# Patient Record
Sex: Male | Born: 1950 | Hispanic: No | Marital: Single | Smoking: Current every day smoker
Health system: Southern US, Community
[De-identification: ages and names within clinical notes are randomized; demographics above are authoritative.]

---

## 2016-10-16 ENCOUNTER — Inpatient Hospital Stay (HOSPITAL_COMMUNITY): Payer: Medicare Other

## 2016-10-16 ENCOUNTER — Emergency Department (HOSPITAL_COMMUNITY): Payer: Medicare Other

## 2016-10-16 ENCOUNTER — Inpatient Hospital Stay (HOSPITAL_COMMUNITY)
Admission: EM | Admit: 2016-10-16 | Discharge: 2016-10-23 | DRG: 270 | Disposition: A | Discharge status: Home / Self Care | Payer: Medicare Other | Attending: Cardiovascular Disease | Admitting: Cardiovascular Disease

## 2016-10-16 ENCOUNTER — Encounter (HOSPITAL_COMMUNITY)
Admission: EM | Disposition: A | Discharge status: Home / Self Care | Payer: Self-pay | Source: Home / Self Care | Attending: Cardiovascular Disease

## 2016-10-16 ENCOUNTER — Encounter (HOSPITAL_COMMUNITY): Payer: Self-pay | Admitting: Cardiology

## 2016-10-16 DIAGNOSIS — I5021 Acute systolic (congestive) heart failure: Secondary | ICD-10-CM | POA: Diagnosis not present

## 2016-10-16 DIAGNOSIS — R739 Hyperglycemia, unspecified: Secondary | ICD-10-CM | POA: Diagnosis present

## 2016-10-16 DIAGNOSIS — Z7982 Long term (current) use of aspirin: Secondary | ICD-10-CM

## 2016-10-16 DIAGNOSIS — E874 Mixed disorder of acid-base balance: Secondary | ICD-10-CM | POA: Diagnosis present

## 2016-10-16 DIAGNOSIS — R570 Cardiogenic shock: Secondary | ICD-10-CM | POA: Diagnosis present

## 2016-10-16 DIAGNOSIS — I472 Ventricular tachycardia: Secondary | ICD-10-CM | POA: Diagnosis present

## 2016-10-16 DIAGNOSIS — E871 Hypo-osmolality and hyponatremia: Secondary | ICD-10-CM | POA: Diagnosis not present

## 2016-10-16 DIAGNOSIS — I2102 ST elevation (STEMI) myocardial infarction involving left anterior descending coronary artery: Secondary | ICD-10-CM | POA: Diagnosis present

## 2016-10-16 DIAGNOSIS — J9600 Acute respiratory failure, unspecified whether with hypoxia or hypercapnia: Secondary | ICD-10-CM

## 2016-10-16 DIAGNOSIS — I469 Cardiac arrest, cause unspecified: Secondary | ICD-10-CM | POA: Diagnosis present

## 2016-10-16 DIAGNOSIS — F17210 Nicotine dependence, cigarettes, uncomplicated: Secondary | ICD-10-CM | POA: Diagnosis present

## 2016-10-16 DIAGNOSIS — F17200 Nicotine dependence, unspecified, uncomplicated: Secondary | ICD-10-CM | POA: Diagnosis present

## 2016-10-16 DIAGNOSIS — Z9889 Other specified postprocedural states: Secondary | ICD-10-CM

## 2016-10-16 DIAGNOSIS — I340 Nonrheumatic mitral (valve) insufficiency: Secondary | ICD-10-CM | POA: Diagnosis present

## 2016-10-16 DIAGNOSIS — K4020 Bilateral inguinal hernia, without obstruction or gangrene, not specified as recurrent: Secondary | ICD-10-CM | POA: Diagnosis present

## 2016-10-16 DIAGNOSIS — I4510 Unspecified right bundle-branch block: Secondary | ICD-10-CM | POA: Diagnosis present

## 2016-10-16 DIAGNOSIS — J690 Pneumonitis due to inhalation of food and vomit: Secondary | ICD-10-CM | POA: Diagnosis not present

## 2016-10-16 DIAGNOSIS — I2510 Atherosclerotic heart disease of native coronary artery without angina pectoris: Secondary | ICD-10-CM | POA: Diagnosis present

## 2016-10-16 DIAGNOSIS — J9690 Respiratory failure, unspecified, unspecified whether with hypoxia or hypercapnia: Secondary | ICD-10-CM | POA: Diagnosis present

## 2016-10-16 DIAGNOSIS — J9601 Acute respiratory failure with hypoxia: Secondary | ICD-10-CM | POA: Diagnosis present

## 2016-10-16 DIAGNOSIS — G9340 Encephalopathy, unspecified: Secondary | ICD-10-CM | POA: Diagnosis present

## 2016-10-16 DIAGNOSIS — Z955 Presence of coronary angioplasty implant and graft: Secondary | ICD-10-CM

## 2016-10-16 DIAGNOSIS — I213 ST elevation (STEMI) myocardial infarction of unspecified site: Secondary | ICD-10-CM | POA: Diagnosis present

## 2016-10-16 DIAGNOSIS — R680 Hypothermia, not associated with low environmental temperature: Secondary | ICD-10-CM | POA: Diagnosis not present

## 2016-10-16 DIAGNOSIS — I4901 Ventricular fibrillation: Secondary | ICD-10-CM

## 2016-10-16 DIAGNOSIS — E876 Hypokalemia: Secondary | ICD-10-CM | POA: Diagnosis not present

## 2016-10-16 DIAGNOSIS — G931 Anoxic brain damage, not elsewhere classified: Secondary | ICD-10-CM

## 2016-10-16 HISTORY — PX: IABP INSERTION: CATH118242

## 2016-10-16 HISTORY — PX: CARDIOVERSION: EP1203

## 2016-10-16 HISTORY — PX: ABDOMINAL AORTOGRAM: CATH118222

## 2016-10-16 HISTORY — PX: LEFT HEART CATH AND CORONARY ANGIOGRAPHY: CATH118249

## 2016-10-16 LAB — I-STAT TROPONIN, ED: Troponin i, poc: 0.89 ng/mL (ref 0.00–0.08)

## 2016-10-16 LAB — POCT I-STAT 3, ART BLOOD GAS (G3+)
ACID-BASE DEFICIT: 2 mmol/L (ref 0.0–2.0)
ACID-BASE DEFICIT: 8 mmol/L — AB (ref 0.0–2.0)
Acid-base deficit: 12 mmol/L — ABNORMAL HIGH (ref 0.0–2.0)
Acid-base deficit: 17 mmol/L — ABNORMAL HIGH (ref 0.0–2.0)
Acid-base deficit: 6 mmol/L — ABNORMAL HIGH (ref 0.0–2.0)
Bicarbonate: 13.7 mmol/L — ABNORMAL LOW (ref 20.0–28.0)
Bicarbonate: 17.4 mmol/L — ABNORMAL LOW (ref 20.0–28.0)
Bicarbonate: 20.1 mmol/L (ref 20.0–28.0)
Bicarbonate: 24 mmol/L (ref 20.0–28.0)
Bicarbonate: 21.8 mmol/L (ref 20.0–28.0)
O2 SAT: 90 %
O2 SAT: 85 %
O2 Saturation: 100 %
O2 Saturation: 100 %
O2 Saturation: 97 %
PCO2 ART: 52.9 mmHg — AB (ref 32.0–48.0)
PCO2 ART: 39.9 mmHg (ref 32.0–48.0)
PCO2 ART: 49.2 mmHg — AB (ref 32.0–48.0)
PH ART: 7.045 — AB (ref 7.350–7.450)
PH ART: 7.155 — AB (ref 7.350–7.450)
PO2 ART: 73 mmHg — AB (ref 83.0–108.0)
PO2 ART: 61 mmHg — AB (ref 83.0–108.0)
Patient temperature: 102.5
TCO2: 15 mmol/L (ref 0–100)
TCO2: 19 mmol/L (ref 0–100)
TCO2: 21 mmol/L (ref 0–100)
TCO2: 25 mmol/L (ref 0–100)
TCO2: 24 mmol/L (ref 0–100)
pCO2 arterial: 50.1 mmHg — ABNORMAL HIGH (ref 32.0–48.0)
pCO2 arterial: 60.8 mmHg — ABNORMAL HIGH (ref 32.0–48.0)
pH, Arterial: 7.126 — CL (ref 7.350–7.450)
pH, Arterial: 7.31 — ABNORMAL LOW (ref 7.350–7.450)
pH, Arterial: 7.306 — ABNORMAL LOW (ref 7.350–7.450)
pO2, Arterial: 247 mmHg — ABNORMAL HIGH (ref 83.0–108.0)
pO2, Arterial: 378 mmHg — ABNORMAL HIGH (ref 83.0–108.0)
pO2, Arterial: 102 mmHg (ref 83.0–108.0)

## 2016-10-16 LAB — COMPREHENSIVE METABOLIC PANEL
ALBUMIN: 3.9 g/dL (ref 3.5–5.0)
ALT: 23 U/L (ref 17–63)
AST: 37 U/L (ref 15–41)
Alkaline Phosphatase: 71 U/L (ref 38–126)
Anion gap: 11 (ref 5–15)
BILIRUBIN TOTAL: 0.5 mg/dL (ref 0.3–1.2)
BUN: 8 mg/dL (ref 6–20)
CHLORIDE: 106 mmol/L (ref 101–111)
CO2: 22 mmol/L (ref 22–32)
CREATININE: 1.06 mg/dL (ref 0.61–1.24)
Calcium: 9.3 mg/dL (ref 8.9–10.3)
GFR calc Af Amer: >60 mL/min (ref >60)
GLUCOSE: 155 mg/dL — AB (ref 65–99)
Potassium: 3.5 mmol/L (ref 3.5–5.1)
Sodium: 139 mmol/L (ref 135–145)
TOTAL PROTEIN: 7.3 g/dL (ref 6.5–8.1)

## 2016-10-16 LAB — PROTIME-INR
INR: 0.96
INR: 1.59
Prothrombin Time: 12.8 seconds (ref 11.4–15.2)
Prothrombin Time: 19.1 seconds — ABNORMAL HIGH (ref 11.4–15.2)

## 2016-10-16 LAB — CBC WITH DIFFERENTIAL/PLATELET
Basophils Absolute: 0 10*3/uL (ref 0.0–0.1)
Basophils Relative: 0 %
Eosinophils Absolute: 0.2 10*3/uL (ref 0.0–0.7)
Eosinophils Relative: 1 %
HCT: 46.1 % (ref 39.0–52.0)
Hemoglobin: 15.2 g/dL (ref 13.0–17.0)
Lymphocytes Relative: 35 %
Lymphs Abs: 7.4 10*3/uL — ABNORMAL HIGH (ref 0.7–4.0)
MCH: 25.9 pg — ABNORMAL LOW (ref 26.0–34.0)
MCHC: 33 g/dL (ref 30.0–36.0)
MCV: 78.5 fL (ref 78.0–100.0)
Monocytes Absolute: 1.3 10*3/uL — ABNORMAL HIGH (ref 0.1–1.0)
Monocytes Relative: 6 %
Neutro Abs: 12.1 10*3/uL — ABNORMAL HIGH (ref 1.7–7.7)
Neutrophils Relative %: 58 %
Platelets: 377 10*3/uL (ref 150–400)
RBC: 5.87 MIL/uL — ABNORMAL HIGH (ref 4.22–5.81)
RDW: 14.3 % (ref 11.5–15.5)
WBC: 21 10*3/uL — ABNORMAL HIGH (ref 4.0–10.5)

## 2016-10-16 LAB — APTT: aPTT: 30 seconds (ref 24–36)

## 2016-10-16 LAB — BASIC METABOLIC PANEL
ANION GAP: 7 (ref 5–15)
BUN: 8 mg/dL (ref 6–20)
CALCIUM: 8.3 mg/dL — AB (ref 8.9–10.3)
CO2: 25 mmol/L (ref 22–32)
Chloride: 108 mmol/L (ref 101–111)
Creatinine, Ser: 0.94 mg/dL (ref 0.61–1.24)
Glucose, Bld: 135 mg/dL — ABNORMAL HIGH (ref 65–99)
POTASSIUM: 3.7 mmol/L (ref 3.5–5.1)
Sodium: 140 mmol/L (ref 135–145)

## 2016-10-16 LAB — I-STAT CHEM 8, ED
BUN: 8 mg/dL (ref 6–20)
CALCIUM ION: 1.15 mmol/L (ref 1.15–1.40)
CHLORIDE: 104 mmol/L (ref 101–111)
CREATININE: 1 mg/dL (ref 0.61–1.24)
GLUCOSE: 154 mg/dL — AB (ref 65–99)
HCT: 49 % (ref 39.0–52.0)
Hemoglobin: 16.7 g/dL (ref 13.0–17.0)
Potassium: 3.3 mmol/L — ABNORMAL LOW (ref 3.5–5.1)
Sodium: 142 mmol/L (ref 135–145)
TCO2: 26 mmol/L (ref 0–100)

## 2016-10-16 LAB — GLUCOSE, CAPILLARY
GLUCOSE-CAPILLARY: 134 mg/dL — AB (ref 65–99)
GLUCOSE-CAPILLARY: 165 mg/dL — AB (ref 65–99)
Glucose-Capillary: 142 mg/dL — ABNORMAL HIGH (ref 65–99)

## 2016-10-16 LAB — TROPONIN I
TROPONIN I: 0.77 ng/mL — AB (ref <0.03)
TROPONIN I: 11.39 ng/mL — AB (ref <0.03)
TROPONIN I: 25.67 ng/mL — AB (ref <0.03)

## 2016-10-16 LAB — MAGNESIUM: Magnesium: 2.3 mg/dL (ref 1.7–2.4)

## 2016-10-16 LAB — RAPID URINE DRUG SCREEN, HOSP PERFORMED
Amphetamines: NOT DETECTED
Barbiturates: NOT DETECTED
Benzodiazepines: POSITIVE — AB
COCAINE: NOT DETECTED
OPIATES: NOT DETECTED
Tetrahydrocannabinol: NOT DETECTED

## 2016-10-16 LAB — LIPID PANEL
Cholesterol: 241 mg/dL — ABNORMAL HIGH (ref 0–200)
HDL: 40 mg/dL — AB (ref >40)
LDL Cholesterol: 173 mg/dL — ABNORMAL HIGH (ref 0–99)
TRIGLYCERIDES: 142 mg/dL (ref <150)
Total CHOL/HDL Ratio: 6 RATIO
VLDL: 28 mg/dL (ref 0–40)

## 2016-10-16 LAB — LACTIC ACID, PLASMA
LACTIC ACID, VENOUS: 2.5 mmol/L — AB (ref 0.5–1.9)
Lactic Acid, Venous: 2.6 mmol/L (ref 0.5–1.9)

## 2016-10-16 LAB — POCT ACTIVATED CLOTTING TIME: ACTIVATED CLOTTING TIME: 378 s

## 2016-10-16 LAB — TSH: TSH: 1.828 u[IU]/mL (ref 0.350–4.500)

## 2016-10-16 LAB — MRSA PCR SCREENING: MRSA by PCR: NEGATIVE

## 2016-10-16 LAB — ETHANOL: Alcohol, Ethyl (B): <5 mg/dL (ref <5)

## 2016-10-16 LAB — PROCALCITONIN: PROCALCITONIN: 0.24 ng/mL

## 2016-10-16 LAB — HEPARIN LEVEL (UNFRACTIONATED): Heparin Unfractionated: 0.19 IU/mL — ABNORMAL LOW (ref 0.30–0.70)

## 2016-10-16 LAB — PATHOLOGIST SMEAR REVIEW

## 2016-10-16 SURGERY — LEFT HEART CATH AND CORONARY ANGIOGRAPHY
Anesthesia: LOCAL

## 2016-10-16 MED ORDER — AMIODARONE HCL 150 MG/3ML IV SOLN
INTRAVENOUS | Status: AC
  Filled 2016-10-16: qty 3

## 2016-10-16 MED ORDER — SODIUM CHLORIDE 0.9 % IV SOLN
INTRAVENOUS | Status: DC
  Administered 2016-10-16 – 2016-10-18 (×2): via INTRAVENOUS

## 2016-10-16 MED ORDER — ONDANSETRON HCL 4 MG/2ML IJ SOLN: 4.0000 mg | Freq: Four times a day (QID) | INTRAMUSCULAR | Status: DC | PRN

## 2016-10-16 MED ORDER — SODIUM CHLORIDE 0.9 % IV SOLN: 4.0000 ug/kg/min | INTRAVENOUS | Status: DC

## 2016-10-16 MED ORDER — SODIUM BICARBONATE 8.4 % IV SOLN
INTRAVENOUS | Status: AC
  Filled 2016-10-16: qty 100

## 2016-10-16 MED ORDER — HEPARIN (PORCINE) IN NACL 2-0.9 UNIT/ML-% IJ SOLN
INTRAMUSCULAR | Status: AC
Start: 2016-10-16 — End: 2016-10-16
  Filled 2016-10-16: qty 500

## 2016-10-16 MED ORDER — CHLORHEXIDINE GLUCONATE 0.12% ORAL RINSE (MEDLINE KIT)
15.0000 mL | Freq: Two times a day (BID) | OROMUCOSAL | Status: DC
  Administered 2016-10-16 – 2016-10-20 (×8): 15 mL via OROMUCOSAL

## 2016-10-16 MED ORDER — ACETAMINOPHEN 325 MG PO TABS
650.0000 mg | ORAL_TABLET | ORAL | Status: DC | PRN
  Administered 2016-10-16 – 2016-10-17 (×2): 650 mg via ORAL
  Filled 2016-10-16 (×2): qty 2

## 2016-10-16 MED ORDER — DOPAMINE-DEXTROSE 3.2-5 MG/ML-% IV SOLN
2.0000 ug/kg/min | INTRAVENOUS | Status: DC
  Administered 2016-10-16: 15 ug/kg/min via INTRAVENOUS

## 2016-10-16 MED ORDER — SODIUM BICARBONATE 8.4 % IV SOLN
INTRAVENOUS | Status: DC | PRN
  Administered 2016-10-16: 50 meq via INTRAVENOUS

## 2016-10-16 MED ORDER — SODIUM BICARBONATE 8.4 % IV SOLN
INTRAVENOUS | Status: AC
  Filled 2016-10-16: qty 50

## 2016-10-16 MED ORDER — IOPAMIDOL (ISOVUE-370) INJECTION 76%
INTRAVENOUS | Status: AC
  Filled 2016-10-16: qty 100

## 2016-10-16 MED ORDER — VECURONIUM BROMIDE 10 MG IV SOLR
6.0000 mg | Freq: Once | INTRAVENOUS | Status: AC
  Administered 2016-10-16: 6 mg via INTRAVENOUS

## 2016-10-16 MED ORDER — LIDOCAINE HCL (PF) 1 % IJ SOLN
INTRAMUSCULAR | Status: DC | PRN
  Administered 2016-10-16: 15 mL via INTRADERMAL

## 2016-10-16 MED ORDER — SODIUM CHLORIDE 0.9% FLUSH
3.0000 mL | Freq: Two times a day (BID) | INTRAVENOUS | Status: DC
  Administered 2016-10-16 – 2016-10-22 (×12): 3 mL via INTRAVENOUS

## 2016-10-16 MED ORDER — BIVALIRUDIN TRIFLUOROACETATE 250 MG IV SOLR
INTRAVENOUS | Status: AC
  Filled 2016-10-16: qty 250

## 2016-10-16 MED ORDER — TICAGRELOR 90 MG PO TABS
180.0000 mg | ORAL_TABLET | Freq: Once | ORAL | Status: AC
  Administered 2016-10-16: 180 mg via ORAL
  Filled 2016-10-16: qty 2

## 2016-10-16 MED ORDER — HEPARIN SODIUM (PORCINE) 5000 UNIT/ML IJ SOLN
4000.0000 [IU] | Freq: Once | INTRAMUSCULAR | Status: AC
  Administered 2016-10-16: 4000 [IU] via INTRAVENOUS

## 2016-10-16 MED ORDER — FENTANYL BOLUS VIA INFUSION
25.0000 ug | INTRAVENOUS | Status: DC | PRN
  Administered 2016-10-17: 25 ug via INTRAVENOUS
  Filled 2016-10-16: qty 25

## 2016-10-16 MED ORDER — MIDAZOLAM HCL 2 MG/2ML IJ SOLN: 1.0000 mg | INTRAMUSCULAR | Status: DC | PRN

## 2016-10-16 MED ORDER — SODIUM CHLORIDE 0.9% FLUSH: 10.0000 mL | INTRAVENOUS | Status: DC | PRN

## 2016-10-16 MED ORDER — DEXTROSE 5 % IV SOLN
INTRAVENOUS | Status: AC | PRN
  Administered 2016-10-16: 300 mg via INTRAVENOUS

## 2016-10-16 MED ORDER — HEPARIN SODIUM (PORCINE) 5000 UNIT/ML IJ SOLN
INTRAMUSCULAR | Status: AC
  Filled 2016-10-16: qty 1

## 2016-10-16 MED ORDER — SODIUM CHLORIDE 0.9 % IV SOLN
INTRAVENOUS | Status: DC | PRN
  Administered 2016-10-16: 10 mL/h via INTRAVENOUS
  Administered 2016-10-16: 20 mL/h via INTRAVENOUS

## 2016-10-16 MED ORDER — CANGRELOR TETRASODIUM 50 MG IV SOLR
INTRAVENOUS | Status: AC
  Filled 2016-10-16: qty 50

## 2016-10-16 MED ORDER — DOPAMINE-DEXTROSE 3.2-5 MG/ML-% IV SOLN
INTRAVENOUS | Status: DC | PRN
  Administered 2016-10-16: 20 ug/kg/min via INTRAVENOUS

## 2016-10-16 MED ORDER — LIDOCAINE HCL (CARDIAC) 20 MG/ML IV SOLN
INTRAVENOUS | Status: DC | PRN
  Administered 2016-10-16: 100 mg via INTRAVENOUS

## 2016-10-16 MED ORDER — MIDAZOLAM HCL 2 MG/2ML IJ SOLN
INTRAMUSCULAR | Status: DC | PRN
  Administered 2016-10-16: 1 mg via INTRAVENOUS
  Administered 2016-10-16: 2 mg via INTRAVENOUS
  Administered 2016-10-16: 1 mg via INTRAVENOUS

## 2016-10-16 MED ORDER — MIDAZOLAM HCL 2 MG/2ML IJ SOLN
2.0000 mg | INTRAMUSCULAR | Status: DC | PRN
  Administered 2016-10-16: 2 mg via INTRAVENOUS
  Filled 2016-10-16: qty 2

## 2016-10-16 MED ORDER — MAGNESIUM SULFATE 50 % IJ SOLN
INTRAMUSCULAR | Status: AC
  Filled 2016-10-16: qty 2

## 2016-10-16 MED ORDER — SODIUM CHLORIDE 0.9 % IV SOLN
1.7500 mg/kg/h | INTRAVENOUS | Status: AC
  Administered 2016-10-16 (×3): 1.75 mg/kg/h via INTRAVENOUS
  Filled 2016-10-16 (×3): qty 250

## 2016-10-16 MED ORDER — AMIODARONE HCL IN DEXTROSE 360-4.14 MG/200ML-% IV SOLN
INTRAVENOUS | Status: DC | PRN
  Administered 2016-10-16: 30 mg/h via INTRAVENOUS

## 2016-10-16 MED ORDER — ETOMIDATE 2 MG/ML IV SOLN
INTRAVENOUS | Status: AC | PRN
  Administered 2016-10-16: 20 mg via INTRAVENOUS

## 2016-10-16 MED ORDER — ACETAMINOPHEN 325 MG PO TABS: 650.0000 mg | ORAL_TABLET | ORAL | Status: DC | PRN

## 2016-10-16 MED ORDER — BIVALIRUDIN TRIFLUOROACETATE 250 MG IV SOLR
INTRAVENOUS | Status: DC | PRN
  Administered 2016-10-16 (×2): 1.75 mg/kg/h via INTRAVENOUS

## 2016-10-16 MED ORDER — LABETALOL HCL 5 MG/ML IV SOLN: 10.0000 mg | INTRAVENOUS | Status: AC | PRN

## 2016-10-16 MED ORDER — SODIUM CHLORIDE 0.9 % IV SOLN
0.5000 mg/h | INTRAVENOUS | Status: DC
  Administered 2016-10-16: 0.5 mg/h via INTRAVENOUS
  Filled 2016-10-16: qty 10

## 2016-10-16 MED ORDER — CANGRELOR BOLUS VIA INFUSION
INTRAVENOUS | Status: DC | PRN
  Administered 2016-10-16: 2400 ug via INTRAVENOUS

## 2016-10-16 MED ORDER — AMIODARONE HCL IN DEXTROSE 360-4.14 MG/200ML-% IV SOLN: 60.0000 mg/h | INTRAVENOUS | Status: AC

## 2016-10-16 MED ORDER — SODIUM CHLORIDE 0.9 % IV SOLN: 250.0000 mL | INTRAVENOUS | Status: DC | PRN

## 2016-10-16 MED ORDER — HEPARIN (PORCINE) IN NACL 2-0.9 UNIT/ML-% IJ SOLN
INTRAMUSCULAR | Status: AC
  Filled 2016-10-16: qty 500

## 2016-10-16 MED ORDER — DOPAMINE-DEXTROSE 3.2-5 MG/ML-% IV SOLN
INTRAVENOUS | Status: AC
  Filled 2016-10-16: qty 250

## 2016-10-16 MED ORDER — HEPARIN (PORCINE) IN NACL 2-0.9 UNIT/ML-% IJ SOLN
INTRAMUSCULAR | Status: DC | PRN
  Administered 2016-10-16: 1000 mL via INTRA_ARTERIAL

## 2016-10-16 MED ORDER — FENTANYL CITRATE (PF) 100 MCG/2ML IJ SOLN
50.0000 ug | Freq: Once | INTRAMUSCULAR | Status: DC
Start: 2016-10-16 — End: 2016-10-19

## 2016-10-16 MED ORDER — LIDOCAINE HCL 1 % IJ SOLN
INTRAMUSCULAR | Status: AC
  Filled 2016-10-16: qty 20

## 2016-10-16 MED ORDER — CHLORHEXIDINE GLUCONATE CLOTH 2 % EX PADS
6.0000 | MEDICATED_PAD | Freq: Every day | CUTANEOUS | Status: DC
  Administered 2016-10-17 – 2016-10-20 (×5): 6 via TOPICAL

## 2016-10-16 MED ORDER — SODIUM CHLORIDE 0.9 % IV SOLN: INTRAVENOUS | Status: DC

## 2016-10-16 MED ORDER — DOCUSATE SODIUM 50 MG/5ML PO LIQD
100.0000 mg | Freq: Two times a day (BID) | ORAL | Status: DC | PRN
  Administered 2016-10-19: 100 mg
  Filled 2016-10-16 (×2): qty 10

## 2016-10-16 MED ORDER — INSULIN ASPART 100 UNIT/ML ~~LOC~~ SOLN
0.0000 [IU] | SUBCUTANEOUS | Status: DC
Start: 2016-10-16 — End: 2016-10-20
  Administered 2016-10-16: 2 [IU] via SUBCUTANEOUS
  Administered 2016-10-16 – 2016-10-17 (×4): 1 [IU] via SUBCUTANEOUS
  Administered 2016-10-17 – 2016-10-18 (×3): 2 [IU] via SUBCUTANEOUS
  Administered 2016-10-18: 1 [IU] via SUBCUTANEOUS
  Administered 2016-10-18 (×3): 2 [IU] via SUBCUTANEOUS
  Administered 2016-10-19 (×3): 1 [IU] via SUBCUTANEOUS
  Administered 2016-10-19 – 2016-10-20 (×2): 2 [IU] via SUBCUTANEOUS

## 2016-10-16 MED ORDER — FUROSEMIDE 10 MG/ML IJ SOLN
40.0000 mg | Freq: Once | INTRAMUSCULAR | Status: AC
  Administered 2016-10-16: 40 mg via INTRAVENOUS
  Filled 2016-10-16: qty 4

## 2016-10-16 MED ORDER — MIDAZOLAM BOLUS VIA INFUSION
INTRAVENOUS | Status: DC | PRN
  Administered 2016-10-16 (×2): 2 mg via INTRAVENOUS
  Administered 2016-10-16: 3 mg via INTRAVENOUS
  Administered 2016-10-16 (×4): 2 mg via INTRAVENOUS

## 2016-10-16 MED ORDER — FENTANYL 2500MCG IN NS 250ML (10MCG/ML) PREMIX INFUSION
25.0000 ug/h | INTRAVENOUS | Status: DC
  Administered 2016-10-16: 200 ug/h via INTRAVENOUS
  Administered 2016-10-16: 50 ug/h via INTRAVENOUS
  Administered 2016-10-17 (×2): 200 ug/h via INTRAVENOUS
  Administered 2016-10-18: 100 ug/h via INTRAVENOUS
  Administered 2016-10-19: 125 ug/h via INTRAVENOUS
  Filled 2016-10-16 (×6): qty 250

## 2016-10-16 MED ORDER — FENTANYL BOLUS VIA INFUSION
INTRAVENOUS | Status: DC | PRN
  Administered 2016-10-16: 50 ug via INTRAVENOUS
  Administered 2016-10-16 (×2): 75 ug via INTRAVENOUS

## 2016-10-16 MED ORDER — MAGNESIUM SULFATE 50 % IJ SOLN
INTRAMUSCULAR | Status: DC | PRN
  Administered 2016-10-16: 1 g via INTRAVENOUS

## 2016-10-16 MED ORDER — PANTOPRAZOLE SODIUM 40 MG IV SOLR
40.0000 mg | INTRAVENOUS | Status: DC
  Administered 2016-10-16 – 2016-10-17 (×2): 40 mg via INTRAVENOUS
  Filled 2016-10-16 (×2): qty 40

## 2016-10-16 MED ORDER — IOPAMIDOL (ISOVUE-370) INJECTION 76%
INTRAVENOUS | Status: AC
  Filled 2016-10-16: qty 125

## 2016-10-16 MED ORDER — MIDAZOLAM HCL 2 MG/2ML IJ SOLN
INTRAMUSCULAR | Status: AC
  Filled 2016-10-16: qty 2

## 2016-10-16 MED ORDER — ASPIRIN 81 MG PO CHEW
81.0000 mg | CHEWABLE_TABLET | Freq: Every day | ORAL | Status: DC
  Administered 2016-10-17 – 2016-10-19 (×3): 81 mg via ORAL
  Filled 2016-10-16 (×3): qty 1

## 2016-10-16 MED ORDER — HEPARIN (PORCINE) IN NACL 100-0.45 UNIT/ML-% IJ SOLN
1000.0000 [IU]/h | INTRAMUSCULAR | Status: DC
  Administered 2016-10-16: 750 [IU]/h via INTRAVENOUS
  Administered 2016-10-17 – 2016-10-18 (×2): 900 [IU]/h via INTRAVENOUS
  Filled 2016-10-16 (×3): qty 250

## 2016-10-16 MED ORDER — HYDRALAZINE HCL 20 MG/ML IJ SOLN: 5.0000 mg | INTRAMUSCULAR | Status: AC | PRN

## 2016-10-16 MED ORDER — SODIUM CHLORIDE 0.9 % IV SOLN
INTRAVENOUS | Status: DC | PRN
  Administered 2016-10-16: 2 mg/h via INTRAVENOUS

## 2016-10-16 MED ORDER — ROCURONIUM BROMIDE 50 MG/5ML IV SOLN
50.0000 mg | Freq: Once | INTRAVENOUS | Status: DC
  Filled 2016-10-16: qty 5

## 2016-10-16 MED ORDER — FOLIC ACID 5 MG/ML IJ SOLN
1.0000 mg | Freq: Every day | INTRAMUSCULAR | Status: DC
  Administered 2016-10-16 – 2016-10-17 (×2): 1 mg via INTRAVENOUS
  Filled 2016-10-16 (×4): qty 0.2

## 2016-10-16 MED ORDER — SODIUM CHLORIDE 0.9 % IV SOLN
INTRAVENOUS | Status: DC | PRN
  Administered 2016-10-16: 4 ug/kg/min via INTRAVENOUS

## 2016-10-16 MED ORDER — AMIODARONE LOAD VIA INFUSION
INTRAVENOUS | Status: DC | PRN
  Administered 2016-10-16: 150 mg via INTRAVENOUS

## 2016-10-16 MED ORDER — NITROGLYCERIN 1 MG/10 ML FOR IR/CATH LAB
INTRA_ARTERIAL | Status: DC | PRN
  Administered 2016-10-16: 100 ug via INTRACORONARY

## 2016-10-16 MED ORDER — ASPIRIN EC 81 MG PO TBEC: 81.0000 mg | DELAYED_RELEASE_TABLET | Freq: Every day | ORAL | Status: DC

## 2016-10-16 MED ORDER — AMIODARONE HCL IN DEXTROSE 360-4.14 MG/200ML-% IV SOLN
30.0000 mg/h | INTRAVENOUS | Status: DC
  Administered 2016-10-16 – 2016-10-18 (×7): 30 mg/h via INTRAVENOUS
  Filled 2016-10-16 (×6): qty 200

## 2016-10-16 MED ORDER — AMIODARONE HCL 150 MG/3ML IV SOLN
INTRAVENOUS | Status: DC | PRN
  Administered 2016-10-16 (×3): 150 mg via INTRAVENOUS

## 2016-10-16 MED ORDER — ATORVASTATIN CALCIUM 80 MG PO TABS
80.0000 mg | ORAL_TABLET | Freq: Every day | ORAL | Status: DC
  Administered 2016-10-16 – 2016-10-18 (×3): 80 mg via ORAL
  Filled 2016-10-16 (×3): qty 1

## 2016-10-16 MED ORDER — ORAL CARE MOUTH RINSE
15.0000 mL | OROMUCOSAL | Status: DC
  Administered 2016-10-16 – 2016-10-20 (×35): 15 mL via OROMUCOSAL

## 2016-10-16 MED ORDER — SODIUM CHLORIDE 0.9 % IV SOLN
INTRAVENOUS | Status: DC
  Administered 2016-10-16: 10:00:00 via INTRAVENOUS

## 2016-10-16 MED ORDER — EPINEPHRINE PF 1 MG/10ML IJ SOSY
PREFILLED_SYRINGE | INTRAMUSCULAR | Status: AC | PRN
  Administered 2016-10-16 (×2): 1 mg via INTRAVENOUS

## 2016-10-16 MED ORDER — NITROGLYCERIN 1 MG/10 ML FOR IR/CATH LAB
INTRA_ARTERIAL | Status: AC
  Filled 2016-10-16: qty 10

## 2016-10-16 MED ORDER — AMIODARONE HCL IN DEXTROSE 360-4.14 MG/200ML-% IV SOLN
INTRAVENOUS | Status: AC
  Filled 2016-10-16: qty 200

## 2016-10-16 MED ORDER — TICAGRELOR 90 MG PO TABS
90.0000 mg | ORAL_TABLET | Freq: Two times a day (BID) | ORAL | Status: DC
  Administered 2016-10-16 – 2016-10-23 (×14): 90 mg via ORAL
  Filled 2016-10-16 (×14): qty 1

## 2016-10-16 MED ORDER — NOREPINEPHRINE BITARTRATE 1 MG/ML IV SOLN
0.0000 ug/min | INTRAVENOUS | Status: DC
  Administered 2016-10-16: 20 ug/min via INTRAVENOUS
  Administered 2016-10-16: 15 ug/min via INTRAVENOUS
  Administered 2016-10-16: 5 ug/min via INTRAVENOUS
  Administered 2016-10-17: 15 ug/min via INTRAVENOUS
  Filled 2016-10-16 (×4): qty 4

## 2016-10-16 MED ORDER — NITROGLYCERIN 0.4 MG SL SUBL: 0.4000 mg | SUBLINGUAL_TABLET | SUBLINGUAL | Status: DC | PRN

## 2016-10-16 MED ORDER — SODIUM CHLORIDE 0.9 % IV SOLN
0.7500 ug/kg/min | INTRAVENOUS | Status: DC
  Filled 2016-10-16: qty 50

## 2016-10-16 MED ORDER — AMIODARONE HCL 150 MG/3ML IV SOLN: 150.0000 mg | Freq: Once | INTRAVENOUS | Status: DC

## 2016-10-16 MED ORDER — VECURONIUM BROMIDE 10 MG IV SOLR
INTRAVENOUS | Status: AC
  Administered 2016-10-16: 6 mg via INTRAVENOUS
  Filled 2016-10-16: qty 10

## 2016-10-16 MED ORDER — ONDANSETRON HCL 4 MG/2ML IJ SOLN
4.0000 mg | Freq: Once | INTRAMUSCULAR | Status: AC
  Administered 2016-10-16: 4 mg via INTRAVENOUS
  Filled 2016-10-16: qty 2

## 2016-10-16 MED ORDER — IOPAMIDOL (ISOVUE-370) INJECTION 76%
INTRAVENOUS | Status: DC | PRN
  Administered 2016-10-16: 205 mL via INTRA_ARTERIAL

## 2016-10-16 MED ORDER — ASPIRIN 81 MG PO CHEW
324.0000 mg | CHEWABLE_TABLET | Freq: Once | ORAL | Status: AC
  Administered 2016-10-16: 324 mg via ORAL
  Filled 2016-10-16: qty 4

## 2016-10-16 MED ORDER — TICAGRELOR 90 MG PO TABS: 90.0000 mg | ORAL_TABLET | Freq: Two times a day (BID) | ORAL | Status: DC

## 2016-10-16 MED ORDER — SODIUM CHLORIDE 0.9% FLUSH
10.0000 mL | Freq: Two times a day (BID) | INTRAVENOUS | Status: DC
  Administered 2016-10-16 – 2016-10-20 (×7): 10 mL
  Administered 2016-10-20: 40 mL

## 2016-10-16 MED ORDER — VECURONIUM BROMIDE 10 MG IV SOLR
5.0000 mg | Freq: Once | INTRAVENOUS | Status: AC
  Administered 2016-10-16: 5 mg via INTRAVENOUS
  Filled 2016-10-16: qty 10

## 2016-10-16 MED ORDER — SODIUM CHLORIDE 0.9% FLUSH
3.0000 mL | INTRAVENOUS | Status: DC | PRN
  Administered 2016-10-19: 3 mL via INTRAVENOUS
  Filled 2016-10-16: qty 3

## 2016-10-16 MED ORDER — THIAMINE HCL 100 MG/ML IJ SOLN
100.0000 mg | Freq: Every day | INTRAMUSCULAR | Status: DC
  Administered 2016-10-16 – 2016-10-18 (×3): 100 mg via INTRAVENOUS
  Filled 2016-10-16 (×3): qty 2

## 2016-10-16 MED ORDER — SUCCINYLCHOLINE CHLORIDE 20 MG/ML IJ SOLN
INTRAMUSCULAR | Status: AC | PRN
  Administered 2016-10-16: 100 mg via INTRAVENOUS

## 2016-10-16 MED ORDER — BIVALIRUDIN BOLUS VIA INFUSION - CUPID
INTRAVENOUS | Status: DC | PRN
  Administered 2016-10-16: 60 mg via INTRAVENOUS

## 2016-10-16 SURGICAL SUPPLY — 22 items
BALLN EUPHORA RX 2.5X15 (BALLOONS) ×2
BALLN LINEAR 7.5FR IABP 40CC (BALLOONS) ×2
BALLN MOZEC 2.0X12 (BALLOONS) ×2
BALLN ~~LOC~~ EMERGE MR 2.75X12 (BALLOONS) ×2
BALLOON EUPHORA RX 2.5X15 (BALLOONS) ×1 IMPLANT
BALLOON LINEAR 7.5FR IABP 40CC (BALLOONS) ×1 IMPLANT
BALLOON MOZEC 2.0X12 (BALLOONS) ×1 IMPLANT
BALLOON ~~LOC~~ EMERGE MR 2.75X12 (BALLOONS) ×1 IMPLANT
CATH INFINITI 5FR MULTPACK ANG (CATHETERS) ×2 IMPLANT
CATH VISTA GUIDE 6FR XBLAD3.5 (CATHETERS) ×2 IMPLANT
DEVICE SECURE STATLOCK IABP (MISCELLANEOUS) ×4 IMPLANT
GUIDEWIRE 3MM J TIP .035 145 (WIRE) ×2 IMPLANT
HOVERMATT SINGLE USE (MISCELLANEOUS) ×2 IMPLANT
KIT ENCORE 26 ADVANTAGE (KITS) ×2 IMPLANT
KIT HEART LEFT (KITS) ×2 IMPLANT
PACK CARDIAC CATHETERIZATION (CUSTOM PROCEDURE TRAY) ×2 IMPLANT
SHEATH PINNACLE 6F 10CM (SHEATH) ×4 IMPLANT
STENT SYNERGY DES 2.5X16 (Permanent Stent) ×2 IMPLANT
SYR MEDRAD MARK V 150ML (SYRINGE) ×2 IMPLANT
TRANSDUCER W/STOPCOCK (MISCELLANEOUS) ×2 IMPLANT
TUBING CIL FLEX 10 FLL-RA (TUBING) ×2 IMPLANT
WIRE PT2 MS 185 (WIRE) ×2 IMPLANT

## 2016-10-16 NOTE — Progress Notes (Signed)
Orthopedic Tech Progress Note Patient Details:  Gabriel Lopez 1951/03/16 [id number]  Ortho Devices Type of Ortho Device: Knee Immobilizer Ortho Device/Splint Interventions: Application   Saul Fordyce 10/16/2016, 10:17 AM

## 2016-10-16 NOTE — Progress Notes (Signed)
eLink Physician-Brief Progress Note Patient Name: Gabriel Lopez DOB: 03-13-1951 MRN: [id number]   Date of Service  10/16/2016  HPI/Events of Note  ABG on 50%/PRVC 28/TV 490/P 5 = 7.15/60.8/61/21.9.  eICU Interventions  Will order: 1.  Increase PRVC rate to 35, Increase PEEP to 8. 2. Vecuronium 5 mg IV now.  3. Place A-line. 4. ABG at 11: 45 PM.     Intervention Category Major Interventions: Acid-Base disturbance - evaluation and management;Respiratory failure - evaluation and management  [name] Dennard Nip 10/16/2016, 10:42 PM

## 2016-10-16 NOTE — Progress Notes (Signed)
CRITICAL VALUE ALERT  Critical value received:  Lactic 2.6  Date of notification:  10/16/16  Time of notification:  1610  Critical value read back:Yes.    Nurse who received alert:  Deretha Emory, RN  MD notified (1st page):  Dr. Arsenio Loader  Time of first page:  1645  MD notified (2nd page):  Time of second page:  Responding MD:  Dr. Arsenio Loader  Time MD responded:  [phone]

## 2016-10-16 NOTE — Significant Event (Signed)
Prior to insertion of central line, patient was awake, tracking with eyes, and following commands on both sides.    [name], AGACNP-BC [facility] Pgr: [phone] or if no answer [phone] 10/16/2016, 11:46 AM

## 2016-10-16 NOTE — Progress Notes (Signed)
ANTICOAGULATION CONSULT NOTE - Initial Consult  Pharmacy Consult for Heparin Indication: chest pain/ACS  Allergies not on file  Patient Measurements: Height: 5\' 5"  (165.1 cm) Weight: 157 lb 10.1 oz (71.5 kg) IBW/kg (Calculated) : 61.5 Heparin Dosing Weight: 71.5kg  Vital Signs: BP: 118/59 (05/01 1100) Pulse Rate: 221 (05/01 1100)  Labs:  Recent Labs  10/16/16 0652 10/16/16 0700 10/16/16 0949  HGB 15.2 16.7  --   HCT 46.1 49.0  --   PLT 377  --   --   APTT 30  --   --   LABPROT 12.8  --   --   INR 0.96  --   --   CREATININE 1.06 1.00  --   TROPONINI 0.77*  --  11.39*    Estimated Creatinine Clearance: 63.2 mL/min (by C-G formula based on SCr of 1 mg/dL).   Medical History: No past medical history on file.  Medications:  Prescriptions Prior to Admission  Medication Sig Dispense Refill Last Dose  . aspirin EC 81 MG tablet Take 81 mg by mouth daily.   10/16/2016 at Unknown  . ranitidine (ZANTAC) 75 MG tablet Take 150 mg by mouth daily as needed for heartburn.   Unknown at Unknown   Scheduled:  . [START ON 10/17/2016] aspirin  81 mg Oral Daily  . atorvastatin  80 mg Oral q1800  . fentaNYL (SUBLIMAZE) injection  50 mcg Intravenous Once  . folic acid  1 mg Intravenous Daily  . heparin      . pantoprazole (PROTONIX) IV  40 mg Intravenous Q24H  . sodium chloride flush  3 mL Intravenous Q12H  . thiamine injection  100 mg Intravenous Daily  . ticagrelor  90 mg Oral BID   Infusions:  . sodium chloride 10 mL/hr at 10/16/16 1000  . sodium chloride 100 mL/hr at 10/16/16 1000  . sodium chloride    . amiodarone    . amiodarone 30 mg/hr (10/16/16 1235)  . amiodarone    . bivalirudin (ANGIOMAX) infusion 5 mg/mL (Cath Lab,ACS,PCI indication) 1.75 mg/kg/hr (10/16/16 1344)  . DOPamine 10 mcg/kg/min (10/16/16 1300)  . fentaNYL infusion INTRAVENOUS 75 mcg/hr (10/16/16 1300)  . heparin    . norepinephrine (LEVOPHED) Adult infusion      Assessment: Patient is a 66 year old  male admitted 10/16/16 as code STEMI s/p VT and VF w/ CPR in the ED on arrival. In cath, he was stented x2 to LAD and had IABP placed.  Baseline hemoglobin and platelet count are within normal limits.   Goal of Therapy:  Heparin level 0.2-0.5 units/ml Monitor platelets by anticoagulation protocol: Yes   Plan:  Start heparin 750 units/hr IV once bivalirudin is off (~1400) 6-hour heparin level Daily heparin level and CBC  Carylon Perches, PharmD Acute Care Pharmacy Resident  Pager: [phone] 10/16/2016

## 2016-10-16 NOTE — Significant Event (Signed)
Was contacted by nurse taking care of Gabriel Lopez regarding low urinary output and CVP >17.  The left IJ was not central when I viewed th chest x-ray films, but in the setting of low urinary output, concerned for cardiac congestion.  I discontinued the IV fluids and gave 40 mg Lasix.  Will monitor CVP and urinary output and adjust care accordingly over night.  Gabriel Lopez

## 2016-10-16 NOTE — Procedures (Signed)
Central Venous Catheter Insertion Procedure Note Gabriel Lopez [id number] 02-Oct-1950  Procedure: Insertion of Central Venous Catheter Indications: Assessment of intravascular volume, Drug and/or fluid administration and Frequent blood sampling  Procedure Details Consent: Risks of procedure as well as the alternatives and risks of each were explained to the (patient/caregiver).  Consent for procedure obtained. Time Out: Verified patient identification, verified procedure, site/side was marked, verified correct patient position, special equipment/implants available, medications/allergies/relevent history reviewed, required imaging and test results available.  Performed  Maximum sterile technique was used including antiseptics, cap, gloves, gown, hand hygiene, mask and sheet. Skin prep: Chlorhexidine; local anesthetic administered A antimicrobial bonded/coated triple lumen catheter was placed in the left internal jugular vein using the Seldinger technique to 20cm.  Biopatch applied.   Evaluation Blood flow good Complications: No apparent complications Patient did tolerate procedure well. Chest X-ray ordered to verify placement.  CXR: pending.  Procedure performed with ultrasound guidance for real time vessel cannulation.     [name], AGACNP-BC [facility] Pgr: [phone] or if no answer [phone] 10/16/2016, 11:30 AM

## 2016-10-16 NOTE — H&P (Addendum)
Patient ID: Gabriel Lopez MRN: [id number], DOB/AGE: 66-Jun-1952   Admit date: 10/16/2016   Primary Physician: No primary care provider on file. Primary Cardiologist: new Dr Tresa Endo  HPI: 66 y/o single male from [place], works as a Education administrator, "never been to the doctor" per son in law who provided the history, presented to [facility] ED this am with complaints of SSCP, diaphoresis, and nausea and vomiting since 4 am. The son in law told me he hadn't been feeling well for two days. In the ED he went into VF. He was treated with 10 minutes of CPR, defibrillated 5 times, intubated, and given Heparin, Epi, and Amiodarone. His HR and B/P stabilized and he was taken to the cath lab.    Problem List: No past medical history on file.  No past surgical history on file.   Allergies: Allergies not on file   Home Medications ASA 81 mg daily  No past medical history on file.       FmHx- No known family history of CAD. Both parents deceased, he has a brother and three sisters  Social History   Social History  . Marital status: Single    Spouse name: N/A  . Number of children: N/A  . Years of education: N/A   Occupational History  . Not on file.   Social History Main Topics  . Smoking status: Current Every Day Smoker    Types: Cigarettes  . Smokeless tobacco: Never Used  . Alcohol use No  . Drug use: No  . Sexual activity: Not on file   Other Topics Concern  . Not on file   Social History Narrative  . No narrative on file     Review of Systems: Unobtainable- pt in cardiac arrest  Physical Exam: Blood pressure (!) 139/93, pulse 91, resp. rate 12, SpO2 100 %.  General appearance: intubated, not responsive Neck: no carotid bruit and no JVD Lungs: clear to auscultation bilaterally Heart: regular rate and rhythm Abdomen: soft, non-tender; bowel sounds normal; no masses,  no organomegaly and no surgical scars Extremities: extremities normal, atraumatic, no cyanosis or  edema Pulses: diminnished Skin: multiple tatoos all over his body as well as multiple piercings Neurologic: intubated, unresponsive    Labs:   Results for orders placed or performed during the hospital encounter of 10/16/16 (from the past 24 hour(s))  CBC with Differential/Platelet     Status: Abnormal (Preliminary result)   Collection Time: 10/16/16  6:52 AM  Result Value Ref Range   WBC 21.0 (H) 4.0 - 10.5 K/uL   RBC 5.87 (H) 4.22 - 5.81 MIL/uL   Hemoglobin 15.2 13.0 - 17.0 g/dL   HCT 57.8 46.9 - 62.9 %   MCV 78.5 78.0 - 100.0 fL   MCH 25.9 (L) 26.0 - 34.0 pg   MCHC 33.0 30.0 - 36.0 g/dL   RDW 52.8 41.3 - 24.4 %   Platelets 377 150 - 400 K/uL   Neutrophils Relative % PENDING %   Neutro Abs PENDING 1.7 - 7.7 K/uL   Band Neutrophils PENDING %   Lymphocytes Relative PENDING %   Lymphs Abs PENDING 0.7 - 4.0 K/uL   Monocytes Relative PENDING %   Monocytes Absolute PENDING 0.1 - 1.0 K/uL   Eosinophils Relative PENDING %   Eosinophils Absolute PENDING 0.0 - 0.7 K/uL   Basophils Relative PENDING %   Basophils Absolute PENDING 0.0 - 0.1 K/uL   WBC Morphology PENDING    RBC Morphology PENDING  Smear Review PENDING    nRBC PENDING 0 /100 WBC   Metamyelocytes Relative PENDING %   Myelocytes PENDING %   Promyelocytes Absolute PENDING %   Blasts PENDING %  Protime-INR     Status: None   Collection Time: 10/16/16  6:52 AM  Result Value Ref Range   Prothrombin Time 12.8 11.4 - 15.2 seconds   INR 0.96   APTT     Status: None   Collection Time: 10/16/16  6:52 AM  Result Value Ref Range   aPTT 30 24 - 36 seconds  I-stat chem 8, ed     Status: Abnormal   Collection Time: 10/16/16  7:00 AM  Result Value Ref Range   Sodium 142 135 - 145 mmol/L   Potassium 3.3 (L) 3.5 - 5.1 mmol/L   Chloride 104 101 - 111 mmol/L   BUN 8 6 - 20 mg/dL   Creatinine, Ser 1.61 0.61 - 1.24 mg/dL   Glucose, Bld 096 (H) 65 - 99 mg/dL   Calcium, Ion 0.45 4.09 - 1.40 mmol/L   TCO2 26 0 - 100 mmol/L    Hemoglobin 16.7 13.0 - 17.0 g/dL   HCT 81.1 91.4 - 78.2 %  I-stat troponin, ED     Status: Abnormal   Collection Time: 10/16/16  7:00 AM  Result Value Ref Range   Troponin i, poc 0.89 (HH) 0.00 - 0.08 ng/mL   Comment NOTIFIED PHYSICIAN    Comment 3             Radiology/Studies: Dg Chest Port 1 View  Result Date: 10/16/2016 CLINICAL DATA:  Code STEMI, chest pain. EXAM: PORTABLE CHEST 1 VIEW COMPARISON:  None in PACs FINDINGS: The lungs are adequately inflated. There is no focal infiltrate. The interstitial markings are mildly prominent. The heart is normal in size. The central pulmonary vascularity is prominent. The mediastinum is normal in width. There is no pleural effusion. The observed bony thorax is unremarkable. IMPRESSION: Mild interstitial prominence bilaterally may reflect low-grade pulmonary edema. No cardiomegaly or alveolar pneumonia. Electronically Signed   By: David  Swaz[place]and M.D.   On: 10/16/2016 07:16    EKG:NSR with 3 mm anterior septal ST elevation  ASSESSMENT AND PLAN:  Active Problems:   STEMI (ST elevation myocardial infarction) [facility])   Cardiac arrest [facility])   Smoker   PLAN: Urgent cath   [name], PA-C 10/16/2016, 7:35 AM [phone]  Patient seen and examined. Agree with assessment and plan. 66 year old gentleman who has not had any medical care and reportedly has never seen a physician.  He has not been on medications.  He was awakened with severe chest pain, diaphoresis, nausea and vomiting at 4 AM today.  [facility] he was found to have marked anterior ST segment elevation.  A code STEMI was activated.  In the ER, he went into VT and VF and required defibrillation 5.  He was intubated, given IV heparin, received 300 mg bolus of amiodarone.  Once his heart rhythm stabilized he is now taken emergently to the cardiac catheterization laboratory for acute catheterization and probable intervention.  Upon arrival to the  catheterization laboratory, his systolic blood pressure  was 75-80 mm and he is beginning to awaken.  He was started on dopamine for cardiogenic shock.  Lennette Bihari, MD, [facility] 10/16/2016 9:34 AM

## 2016-10-16 NOTE — Consult Note (Signed)
PULMONARY / CRITICAL CARE MEDICINE   Name: Gabriel Lopez MRN: [id number] DOB: 08/16/1950    ADMISSION DATE:  10/16/2016 CONSULTATION DATE:  10/16/2016  REFERRING MD:  Dr. Tresa Endo  CHIEF COMPLAINT:  Cardiac Arrest  HISTORY OF PRESENT ILLNESS:   Information obtained from thorough medical review since the patient is currently intubated and sedated.    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  He presented to the ED this am with complaints of progressively worse SSCP since 0400 with associated diaphoresis, nausea, and vomiting.  Per the family, he has not been feeling well for several days per his family.  In the ED, EKG showed an anterior STEMI.   He went into VF to ?Torsades arrest, treated with CPR, epi, defibrillated 5 times, and amiodarone for 10 mins prior to ROSC,  Afterwards, he became uncooperative and verbally belligerent and therefore intubated for airway protection and taken to the Cath lab.    In the cath lab, he was stented x 2 for LAD occulusion, placed on IABP and vasopressors for cardiogenic shock.  He was intermittently agitated during procedure.  PCCM consulted.    PAST MEDICAL HISTORY :  He  has no past medical history on file.  PAST SURGICAL HISTORY: He  has no past surgical history on file.  Allergies not on file  No current facility-administered medications on file prior to encounter.    No current outpatient prescriptions on file prior to encounter.    FAMILY HISTORY:  His has no family status information on file.    SOCIAL HISTORY: He  reports that he has been smoking Cigarettes.  He has never used smokeless tobacco. He reports that he does not drink alcohol or use drugs.  REVIEW OF SYSTEMS:  Unable to assess.  The patient is currently intubated and sedated.    SUBJECTIVE:  Sedated and intubated.    VITAL SIGNS: BP 102/67   Pulse (!) 0   Resp (!) 0   SpO2 (!)  0%   HEMODYNAMICS:    VENTILATOR SETTINGS: Vent Mode: PRVC FiO2 (%):  [100 %] 100 % Set Rate:  [18 bmp-28 bmp] 28 bmp Vt Set:  [590 mL] 590 mL PEEP:  [5 cmH20] 5 cmH20 Plateau Pressure:  [17 cmH20-28 cmH20] 28 cmH20  INTAKE / OUTPUT: No intake/output data recorded.  PHYSICAL EXAMINATION: General:  Older than stated age adult male sedated on MV HEENT: MM pink/moist, ETT, multiple facial piercings  Neuro: Sedated, agitated at times, does not follow commands, MAE CV: s1s2 rrr, mechanical sounds from IABP, right fem site wnl, + dp pulse and left radial pulse PULM: even/non-labored on MV, lungs bilaterally clear, diminished in the bases OI:ZTIW, non-tender, mechanical sounds, bilateral inguinal hernia Extremities: warm/dry, no edema  Skin: no rashes or lesions, multiple tattoos    LABS:  BMET  Recent Labs Lab 10/16/16 0652 10/16/16 0700  NA 139 142  K 3.5 3.3*  CL 106 104  CO2 22  --   BUN 8 8  CREATININE 1.06 1.00  GLUCOSE 155* 154*    Electrolytes  Recent Labs Lab 10/16/16 0652  CALCIUM 9.3    CBC  Recent Labs Lab 10/16/16 0652 10/16/16 0700  WBC 21.0*  --   HGB 15.2 16.7  HCT 46.1 49.0  PLT 377  --     Coag's  Recent Labs Lab 10/16/16 0652  APTT 30  INR 0.96  Sepsis Markers No results for input(s): LATICACIDVEN, PROCALCITON, O2SATVEN in the last 168 hours.  ABG  Recent Labs Lab 10/16/16 0814 10/16/16 0823  PHART 7.045* 7.126*  PCO2ART 50.1* 52.9*  PO2ART 378.0* 247.0*    Liver Enzymes  Recent Labs Lab 10/16/16 0652  AST 37  ALT 23  ALKPHOS 71  BILITOT 0.5  ALBUMIN 3.9    Cardiac Enzymes  Recent Labs Lab 10/16/16 0652  TROPONINI 0.77*    Glucose No results for input(s): GLUCAP in the last 168 hours.  Imaging Dg Chest Port 1 View  Result Date: 10/16/2016 CLINICAL DATA:  Code STEMI, chest pain. EXAM: PORTABLE CHEST 1 VIEW COMPARISON:  None in PACs FINDINGS: The lungs are adequately inflated. There is no focal  infiltrate. The interstitial markings are mildly prominent. The heart is normal in size. The central pulmonary vascularity is prominent. The mediastinum is normal in width. There is no pleural effusion. The observed bony thorax is unremarkable. IMPRESSION: Mild interstitial prominence bilaterally may reflect low-grade pulmonary edema. No cardiomegaly or alveolar pneumonia. Electronically Signed   By: David  [place]iland M.D.   On: 10/16/2016 07:16     STUDIES:  EKG 5/1 > anterior STEMI Cardiac Cath 5/1 >> PCXR 5/1 >>   CULTURES: BC x 2 5/1 >  5/1 >   ANTIBIOTICS:   SIGNIFICANT EVENTS: 5/1 > Admit STEMI/ Cardiac arrest/ cath lab stent x 2/ IABP  LINES/TUBES: ETT 5/1 R sheath 5/1   DISCUSSION: 58 yoM admitted with anterior STEMI, progressed to VF/ ? torsedes arrest,  epi, shocked 5 times, amio/lido, CPR x 10 mins before ROSC.  Intubated afterwards airway protection and combativeness.  Taken to the Cath lab, stented x 2 to LAD and IABP placed.    ASSESSMENT / PLAN:  PULMONARY A: Acute Respiratory Failure - in the setting of STEMI/ Cardiac Arrest Respiratory acidosis -  P:   PRVC 8cc/kg Increase rate to 28 ABG at 1000 Wean FiO2/ PEEP for sats > 94% VAP protocol CXR now  CXR in am ABG as needed  Daily SBT when stable   CARDIOVASCULAR A:  Anterior STEMI - s/p 2 stents to LAD VF Cardiac Arrest - ? Torsades - down 10 min prior to ROSC  Cardiogenic Shock - IABP 5/1 P:  Defer cooling at this point given severity of cardiogenic shock and moving all extremities  Appreciate Cards input Continue IABP per Cards Goal MAP > 65 Place CVC now  Continue dopamine, would ideally like to switch to levophed after line placement  Cangrelor / Angiomax/ brilinta per Cards Continue Amio gtt Checking electrolytes  Continue ASA Trend EKG/ troponin Check TSH ICU monitoring   RENAL A:   Mixed respiratory /metabolic acidosis- in the setting of cardiac arrest  Goal Mag > 2, K >4 for  arrythmia/ VF arrest  P:   NS @ 100 ml /hr  Check mag and phos now Trend BMP q 12 Trend urinary output Replace electrolytes as indicated Avoid nephrotoxic agents, ensure adequate renal perfusion   GASTROINTESTINAL A:   No acute issues Bilateral Inguinal hernias P:   NPO OGT Consider starting TF tomorrow  Start Protonix for SUP   HEMATOLOGIC A:   No acute issues  P:  Systemic anticoagulation as above SCDs for DVT prophylaxis Trend CBCs  INFECTIOUS A:   Leukocytosis- likely reactive  P:   Monitor fever curve Trend WBC Trend PCT  Send BC x2  and tracheal aspirate for culture  ENDOCRINE A:   No acute issues  P:   Check TSH  Trend glucose on BMP  NEUROLOGIC A:   Acute encephalopathy- in the setting of cardiac arrest  P:   RASS goal: -2/-3 Fentanyl/ versed for PAD protocol Assess for daily wakeup assessments/ SBT when able/stable Check UDS  Start folic acid/ thiamine   FAMILY  - Updates: No family available at bedside.   - Inter-disciplinary family meet or Palliative Care meeting due by:  Oct 23, 2016  CC time: 60 mins   [name], [facility] Pgr: [phone] or if no answer [phone] 10/16/2016, 10:28 AM  Attending Note:  66 year old male with PMH above presenting to the hospital after a cardiac arrest.  Patient was noted to have a STEMI and was taken to the cath lab.  Arrest occurred in the ED and patient was intubated there.  Time to ROSC is 10 minutes.  Stents were placed and patient was started on IABP and dopamine.  Given hemodynamics decision was made to not intubate patient.  On exam, patient remains sedated, intubated and paralyzed with clear lungs.  I reviewed CXR myself, ETT in good position.  Will start normothermia protocol.  Full vent support.  Adjust vent for ABG.  ABG and CXR in AM.  PCCM will follow.  Will need to assess neuro status once hemodynamics are improved.  Discussed with cardiology.  The patient  is critically ill with multiple organ systems failure and requires high complexity decision making for assessment and support, frequent evaluation and titration of therapies, application of advanced monitoring technologies and extensive interpretation of multiple databases.   Critical Care Time devoted to patient care services described in this note is  45  Minutes. This time reflects time of care of this signee Dr Koren Bound. This critical care time does not reflect procedure time, or teaching time or supervisory time of PA/NP/Med student/Med Resident etc but could involve care discussion time.  Alyson Reedy, M.D. [facility] Pulmonary/Critical Care Medicine. Pager: [phone]. After hours pager: [phone].

## 2016-10-16 NOTE — Progress Notes (Signed)
ANTICOAGULATION CONSULT NOTE - Initial Consult  Pharmacy Consult for Heparin Indication: IABP  No Known Allergies  Patient Measurements: Height: _0  (165.1 cm) Weight: 157 lb 10.1 oz (71.5 kg) IBW/kg (Calculated) : 61.5 Heparin Dosing Weight: 71.5kg  Vital Signs: Temp: 97 F (36.1 C) (05/01 2100) Temp Source: Other (Comment) (05/01 2000) BP: 116/104 (05/01 2100) Pulse Rate: 88 (05/01 2000)  Labs:  Recent Labs  10/16/16 0652 10/16/16 0700 10/16/16 0949 10/16/16 1133 10/16/16 1436 10/16/16 1458 10/16/16 2057  HGB 15.2 16.7  --   --   --   --   --   HCT 46.1 49.0  --   --   --   --   --   PLT 377  --   --   --   --   --   --   APTT 30  --   --   --   --   --   --   LABPROT 12.8  --   --   --   --  19.1*  --   INR 0.96  --   --   --   --  1.59  --   HEPARINUNFRC  --   --   --   --   --   --  0.19*  CREATININE 1.06 1.00  --   --  0.94  --   --   TROPONINI 0.77*  --  11.39* 25.67*  --   --   --     Estimated Creatinine Clearance: 67.2 mL/min (by C-G formula based on SCr of 0.94 mg/dL).   Medical History: No past medical history on file.  Medications:  Prescriptions Prior to Admission  Medication Sig Dispense Refill Last Dose  . aspirin EC 81 MG tablet Take 81 mg by mouth daily.   10/16/2016 at Unknown  . ranitidine (ZANTAC) 75 MG tablet Take 150 mg by mouth daily as needed for heartburn.   Unknown at Unknown   Scheduled:  . [START ON 10/17/2016] aspirin  81 mg Oral Daily  . atorvastatin  80 mg Oral q1800  . chlorhexidine gluconate (MEDLINE KIT)  15 mL Mouth Rinse BID  . [START ON 10/17/2016] Chlorhexidine Gluconate Cloth  6 each Topical Daily  . fentaNYL (SUBLIMAZE) injection  50 mcg Intravenous Once  . folic acid  1 mg Intravenous Daily  . insulin aspart  0-9 Units Subcutaneous Q4H  . mouth rinse  15 mL Mouth Rinse 10 times per day  . pantoprazole (PROTONIX) IV  40 mg Intravenous Q24H  . sodium chloride flush  10-40 mL Intracatheter Q12H  . sodium chloride  flush  3 mL Intravenous Q12H  . thiamine injection  100 mg Intravenous Daily  . ticagrelor  90 mg Oral BID   Infusions:  . sodium chloride 10 mL/hr at 10/16/16 1000  . sodium chloride    . amiodarone    . amiodarone 30 mg/hr (10/16/16 2158)  . DOPamine Stopped (10/16/16 1806)  . fentaNYL infusion INTRAVENOUS 200 mcg/hr (10/16/16 2010)  . heparin 750 Units/hr (10/16/16 1501)  . norepinephrine (LEVOPHED) Adult infusion 13 mcg/min (10/16/16 2105)    Assessment: Patient is a 66 year old male admitted 10/16/16 as code STEMI s/p VT and VF w/ CPR in the ED on arrival. In cath, he was stented x2 to LAD and had IABP placed.  Heparin to continue while IABP in use.  Heparin level 0.19 on 750 units/hr.  No bleeding noted.  Goal of Therapy:  Heparin level 0.2-0.5 units/ml Monitor platelets by anticoagulation protocol: Yes   Plan:  Increase heparin to 900 units/hr. Next heparin level timed with AM labs. Daily heparin level and CBC  Kendall Arnell, Pharm.D., BCPS Clinical Pharmacist Pager: [phone] Clinical phone for 10/16/2016 from 3:30-11:00 is [phone]. After 11pm, please call Main Rx [phone]) for assistance. 10/16/2016 10:09 PM

## 2016-10-16 NOTE — Progress Notes (Signed)
eLink Physician-Brief Progress Note Patient Name: Gabriel Lopez DOB: 1950-08-04 MRN: [id number]   Date of Service  10/16/2016  HPI/Events of Note  Multiple issues: 1. Blood glucose = 155 and 2. Lactic Acid = 2.6. Suspect elevated Lactic Acid d/t cardiac arrest and/or LV dysfunction.   eICU Interventions  Will order: 1. Q 4 hour sensitive Novolog SSI. 2. Repeat LA at 9 PM.      Intervention Category Major Interventions: Acid-Base disturbance - evaluation and management;Hyperglycemia - active titration of insulin therapy  Lenell Antu 10/16/2016, 4:54 PM

## 2016-10-16 NOTE — Progress Notes (Signed)
eLink Physician-Brief Progress Note Patient Name: Gabriel Lopez DOB: Aug 24, 1950 MRN: [id number]   Date of Service  10/16/2016  HPI/Events of Note  Lactic Acid = 2.6 >> 2.5. IABP at 1:1.   eICU Interventions  Continue present management.      Intervention Category Major Interventions: Acid-Base disturbance - evaluation and management  Kristyana Notte Eugene 10/16/2016, 9:52 PM

## 2016-10-16 NOTE — Progress Notes (Signed)
P note- pt intubated on IV  pressors and IABP. HR 60-70, B/P 122/67. RN reports he had been following commands. I spoke with pt's daughter.  Corine Shelter PA-C 10/16/2016 12:54 PM

## 2016-10-16 NOTE — Progress Notes (Signed)
CRITICAL VALUE ALERT  Critical value received:  Lactic Acid 2.5  Date of notification:  10/16/2016  Time of notification:  2150  Critical value read back:Yes.    Nurse who received alert:  Hillery Jacks, RN  MD notified (1st page):  Dr. Lanier Ensign MD  Time of first page:  2151  Responding MD:  Dr. Lanier Ensign MD  Time MD responded:  2152

## 2016-10-16 NOTE — Procedures (Signed)
Arterial Catheter Insertion Procedure Note Byrl Wint [id number] 10-23-1950  Procedure: Insertion of Arterial Catheter  Indications: Blood pressure monitoring and Frequent blood sampling  Procedure Details Consent: Unable to obtain consent because of altered level of consciousness. Time Out: Verified patient identification, verified procedure, site/side was marked, verified correct patient position, special equipment/implants available, medications/allergies/relevent history reviewed, required imaging and test results available.  Performed  Maximum sterile technique was used including antiseptics, cap, gloves, gown, hand hygiene, mask and sheet. Skin prep: Chlorhexidine; local anesthetic administered 20 gauge catheter was inserted into right radial artery using the Seldinger technique.  Evaluation Blood flow good; BP tracing good. Complications: No apparent complications.   Sharene Skeans 10/16/2016

## 2016-10-16 NOTE — ED Provider Notes (Signed)
[facility]-EMERGENCY DEPT Provider Note   CSN: [id number] Arrival date & time: 10/16/16  3244     History   Chief Complaint Chief Complaint  Patient presents with  . Chest Pain    HPI Gabriel Lopez is a 66 y.o. male.  Level V caveat for acuity condition. Patient arrives with central chest pain that has been constant since 4 AM. Associated with nausea and vomiting and diaphoresis. Patient is uncooperative and verbally belligerent. He is making history and exam difficult and refusing most interventions. EKG shows anterior STEMI. Patient states he's been having ongoing pain over the past one week but became progressively worse this morning. He does not take any medications and has not seen a doctor in decades. No known CAD.   The history is provided by the patient and a relative. The history is limited by the condition of the patient.    No past medical history on file.  Patient Active Problem List   Diagnosis Date Noted  . STEMI (ST elevation myocardial infarction) (HCC) 10/16/2016  . Cardiac arrest (HCC) 10/16/2016  . Smoker 10/16/2016    No past surgical history on file.     Home Medications    Prior to Admission medications   Not on File    Family History No family history on file.  Social History Social History  Substance Use Topics  . Smoking status: Current Every Day Smoker    Types: Cigarettes  . Smokeless tobacco: Never Used  . Alcohol use No     Allergies   Patient has no allergy information on record.   Review of Systems Review of Systems  Constitutional: Negative for activity change, appetite change and fever.  Eyes: Negative for visual disturbance.  Respiratory: Positive for chest tightness and shortness of breath.   Cardiovascular: Positive for chest pain.  Gastrointestinal: Positive for nausea and vomiting. Negative for abdominal pain.  Genitourinary: Negative for dysuria, hematuria and testicular pain.  Musculoskeletal: Negative for  arthralgias and myalgias.  Skin: Negative for rash.  Neurological: Negative for dizziness, weakness and headaches.    all other systems are negative except as noted in the HPI and PMH.    Physical Exam Updated Vital Signs BP (!) 139/93   Pulse 91   Resp 12   SpO2 100%   Physical Exam  Constitutional: He is oriented to person, place, and time. He appears well-developed and well-nourished. No distress.  Patient is uncooperative, does not want to be undressed, does not want vital signs and does not want labs  HENT:  Head: Normocephalic and atraumatic.  Mouth/Throat: Oropharynx is clear and moist. No oropharyngeal exudate.  Eyes: Conjunctivae and EOM are normal. Pupils are equal, round, and reactive to light.  Neck: Normal range of motion. Neck supple.  No meningismus.  Cardiovascular: Normal rate, regular rhythm, normal heart sounds and intact distal pulses.   No murmur heard. Equal radial pulses and grip strengths  Pulmonary/Chest: Effort normal and breath sounds normal. No respiratory distress.  Abdominal: Soft. There is no tenderness. There is no rebound and no guarding.  Musculoskeletal: Normal range of motion. He exhibits no edema or tenderness.  Neurological: He is alert and oriented to person, place, and time. No cranial nerve deficit. He exhibits normal muscle tone. Coordination normal.   5/5 strength throughout. CN 2-12 intact.Equal grip strength.   Skin: Skin is warm.  Psychiatric: He has a normal mood and affect. His behavior is normal.  Nursing note and vitals reviewed.    ED  Treatments / Results  Labs (all labs ordered are listed, but only abnormal results are displayed) Labs Reviewed  CBC WITH DIFFERENTIAL/PLATELET - Abnormal; Notable for the following:       Result Value   WBC 21.0 (*)    RBC 5.87 (*)    MCH 25.9 (*)    All other components within normal limits  I-STAT CHEM 8, ED - Abnormal; Notable for the following:    Potassium 3.3 (*)    Glucose, Bld  154 (*)    All other components within normal limits  I-STAT TROPOININ, ED - Abnormal; Notable for the following:    Troponin i, poc 0.89 (*)    All other components within normal limits  PROTIME-INR  APTT  COMPREHENSIVE METABOLIC PANEL  TROPONIN I  LIPID PANEL    EKG  EKG Interpretation  Date/Time:  Tuesday Oct 16 2016 06:41:32 EDT Ventricular Rate:  82 PR Interval:  184 QRS Duration: 120 QT Interval:  400 QTC Calculation: 467 R Axis:   79 Text Interpretation:  * Critical Test Result: STEMI Normal sinus rhythm Biatrial enlargement Inferior infarct , age undetermined Anterior infarct , possibly acute Lateral injury pattern ** ** ACUTE MI / STEMI ** ** Abnormal ECG anterior STEMI Confirmed by Manus Gunning  MD, Carlyn Lemke [phone]) on 10/16/2016 7:41:43 AM       Radiology Dg Chest Port 1 View  Result Date: 10/16/2016 CLINICAL DATA:  Code STEMI, chest pain. EXAM: PORTABLE CHEST 1 VIEW COMPARISON:  None in PACs FINDINGS: The lungs are adequately inflated. There is no focal infiltrate. The interstitial markings are mildly prominent. The heart is normal in size. The central pulmonary vascularity is prominent. The mediastinum is normal in width. There is no pleural effusion. The observed bony thorax is unremarkable. IMPRESSION: Mild interstitial prominence bilaterally may reflect low-grade pulmonary edema. No cardiomegaly or alveolar pneumonia. Electronically Signed   By: David  [place] M.D.   On: 10/16/2016 07:16    Procedures Procedure Name: Intubation Date/Time: 10/16/2016 8:06 AM Performed by: Glynn Octave Pre-anesthesia Checklist: Patient identified, Emergency Drugs available and Patient being monitored Oxygen Delivery Method: Non-rebreather mask Preoxygenation: Pre-oxygenation with 100% oxygen Intubation Type: IV induction and Rapid sequence Ventilation: Mask ventilation without difficulty Laryngoscope Size: Glidescope and 3 Grade View: Grade I Tube size: 7.5 mm Number of attempts:  1 Airway Equipment and Method: Bougie stylet Placement Confirmation: ETT inserted through vocal cords under direct vision,  Positive ETCO2 and Breath sounds checked- equal and bilateral Secured at: 23 cm Tube secured with: Tape Dental Injury: Teeth and Oropharynx as per pre-operative assessment  Future Recommendations: Recommend- induction with short-acting agent, and alternative techniques readily available     .Cardioversion Date/Time: 10/16/2016 8:31 AM Performed by: Glynn Octave Authorized by: Glynn Octave   Consent:    Consent obtained:  Emergent situation   Consent given by:  Patient Pre-procedure details:    Cardioversion basis:  Emergent   Rhythm:  Ventricular tachycardia   Electrode placement:  Anterior-posterior Attempt one:    Shock outcome:  No change in rhythm Post-procedure details:    Patient status:  Unresponsive Comments:     Chemical cardioversion attempted prior to decompensation and loss of pulses   (including critical care time)  Medications Ordered in ED Medications  0.9 %  sodium chloride infusion (not administered)  heparin 5000 UNIT/ML injection (not administered)  fentaNYL (SUBLIMAZE) injection 50 mcg ( Intravenous MAR Hold 10/16/16 0730)  fentaNYL in NS (72mcg/ml) infusion-PREMIX (not administered)  fentaNYL (SUBLIMAZE)  bolus via infusion 25 mcg ( Intravenous MAR Hold 10/16/16 0730)  midazolam (VERSED) injection 1 mg ( Intravenous MAR Hold 10/16/16 0730)  midazolam (VERSED) injection 1 mg ( Intravenous MAR Hold 10/16/16 0730)  aspirin chewable tablet 324 mg (324 mg Oral Given 10/16/16 0701)  heparin injection 4,000 Units (4,000 Units Intravenous Given 10/16/16 0702)  ondansetron (ZOFRAN) injection 4 mg (4 mg Intravenous Given 10/16/16 0702)  EPINEPHrine (ADRENALIN) 1 MG/10ML injection (1 mg Intravenous Given 10/16/16 0711)  amiodarone (CORDARONE) 300 mg in dextrose 5 % 100 mL bolus ( Intravenous Stopped 10/16/16 0710)  etomidate (AMIDATE)  injection (20 mg Intravenous Given 10/16/16 0714)  succinylcholine (ANECTINE) injection (100 mg Intravenous Given 10/16/16 0715)     Initial Impression / Assessment and Plan / ED Course  I have reviewed the triage vital signs and the nursing notes.  Pertinent labs & imaging results that were available during my care of the patient were reviewed by me and considered in my medical decision making (see chart for details).     Patient presents with central chest pain without nausea and vomiting. EKG consistent with anterior STEMI. Code STEMI activated in triage. Patient moved to trauma room.  Patient requiring constant verbal redirection reassurance to comply with medical staff directions. Given aspirin and heparin. Case discussed with Dr. Tresa Endo of cardiology who will meet patient cath lab.  While in ED, patient developed a wide-complex tachycardia in the 170 range consistent with ventricular tachycardia. Amiodarone bolus and drip were started as patient was still mentating and still had a pulse. Patient subsequently became unresponsive and lost pulses.  ACLS protocol was initiated with CPR, IV epinephrine, IV amiodarone and defibrillations.  Patient received approximately 10 minutes of CPR and 5 shocks for ventricular fibrillation. Intubated in the ED.  Pulses restored approximately 10 minutes of CPR and 5 defibrillations. Amiodarone drip continued. Dr. Tresa Endo at bedside. Patient taken to catheterization lab in critical condition.  Cardiopulmonary Resuscitation (CPR) Procedure Note Directed/Performed by: Glynn Octave I personally directed ancillary staff and/or performed CPR in an effort to regain return of spontaneous circulation and to maintain cardiac, neuro and systemic perfusion.   CRITICAL CARE Performed by: Glynn Octave Total critical care time: 45 minutes Critical care time was exclusive of separately billable procedures and treating other patients. Critical care was  necessary to treat or prevent imminent or life-threatening deterioration. Critical care was time spent personally by me on the following activities: development of treatment plan with patient and/or surrogate as well as nursing, discussions with consultants, evaluation of patient's response to treatment, examination of patient, obtaining history from patient or surrogate, ordering and performing treatments and interventions, ordering and review of laboratory studies, ordering and review of radiographic studies, pulse oximetry and re-evaluation of patient's condition.  Final Clinical Impressions(s) / ED Diagnoses   Final diagnoses:  ST elevation myocardial infarction (STEMI), unspecified artery (HCC)  Cardiac arrest [facility])    New Prescriptions There are no discharge medications for this patient.    Glynn Octave, MD 10/16/16 [phone]

## 2016-10-16 NOTE — ED Notes (Signed)
While preparing patient for Cath Lab, rhythm noted on monitor to have declined into VT. Then the rhythm appeared to be VF then Torsades, then VF again. Patient then lost pulses and consciousness. CPR was started and ventilations were assisted via BVM. Defibrillation was performed x4 without response from VF. Patient was then intubated. After 5th defibrillation, VF resolved into mixture of junctional/sinus and idioventricular rhythm. Transported to Cath lab for decisive intervention.

## 2016-10-17 ENCOUNTER — Encounter (HOSPITAL_COMMUNITY): Payer: Self-pay | Admitting: Cardiovascular Disease

## 2016-10-17 ENCOUNTER — Inpatient Hospital Stay (HOSPITAL_COMMUNITY): Payer: Medicare Other

## 2016-10-17 DIAGNOSIS — I472 Ventricular tachycardia: Secondary | ICD-10-CM

## 2016-10-17 DIAGNOSIS — I2510 Atherosclerotic heart disease of native coronary artery without angina pectoris: Secondary | ICD-10-CM

## 2016-10-17 DIAGNOSIS — R570 Cardiogenic shock: Secondary | ICD-10-CM

## 2016-10-17 LAB — BASIC METABOLIC PANEL
ANION GAP: 8 (ref 5–15)
Anion gap: 9 (ref 5–15)
Anion gap: 11 (ref 5–15)
Anion gap: 9 (ref 5–15)
BUN: 11 mg/dL (ref 6–20)
BUN: 9 mg/dL (ref 6–20)
BUN: 13 mg/dL (ref 6–20)
BUN: 15 mg/dL (ref 6–20)
CALCIUM: 7.9 mg/dL — AB (ref 8.9–10.3)
CHLORIDE: 105 mmol/L (ref 101–111)
CHLORIDE: 106 mmol/L (ref 101–111)
CO2: 22 mmol/L (ref 22–32)
CO2: 23 mmol/L (ref 22–32)
CO2: 18 mmol/L — ABNORMAL LOW (ref 22–32)
CO2: 22 mmol/L (ref 22–32)
CREATININE: 1.24 mg/dL (ref 0.61–1.24)
CREATININE: 1.13 mg/dL (ref 0.61–1.24)
Calcium: 7.9 mg/dL — ABNORMAL LOW (ref 8.9–10.3)
Calcium: 7.8 mg/dL — ABNORMAL LOW (ref 8.9–10.3)
Calcium: 8 mg/dL — ABNORMAL LOW (ref 8.9–10.3)
Chloride: 106 mmol/L (ref 101–111)
Chloride: 104 mmol/L (ref 101–111)
Creatinine, Ser: 1.22 mg/dL (ref 0.61–1.24)
Creatinine, Ser: 1.18 mg/dL (ref 0.61–1.24)
GFR calc Af Amer: >60 mL/min (ref >60)
GFR calc non Af Amer: >60 mL/min (ref >60)
GFR calc non Af Amer: >60 mL/min (ref >60)
GFR calc non Af Amer: >60 mL/min (ref >60)
GFR, EST NON AFRICAN AMERICAN: 59 mL/min — AB (ref >60)
Glucose, Bld: 167 mg/dL — ABNORMAL HIGH (ref 65–99)
Glucose, Bld: 122 mg/dL — ABNORMAL HIGH (ref 65–99)
Glucose, Bld: 105 mg/dL — ABNORMAL HIGH (ref 65–99)
Glucose, Bld: 133 mg/dL — ABNORMAL HIGH (ref 65–99)
POTASSIUM: 4.5 mmol/L (ref 3.5–5.1)
Potassium: 3.8 mmol/L (ref 3.5–5.1)
Potassium: 4.1 mmol/L (ref 3.5–5.1)
Potassium: 3.2 mmol/L — ABNORMAL LOW (ref 3.5–5.1)
SODIUM: 137 mmol/L (ref 135–145)
SODIUM: 136 mmol/L (ref 135–145)
SODIUM: 135 mmol/L (ref 135–145)
Sodium: 135 mmol/L (ref 135–145)

## 2016-10-17 LAB — LIPID PANEL
Cholesterol: 165 mg/dL (ref 0–200)
HDL: 38 mg/dL — ABNORMAL LOW (ref >40)
LDL Cholesterol: 105 mg/dL — ABNORMAL HIGH (ref 0–99)
TRIGLYCERIDES: 108 mg/dL (ref <150)
Total CHOL/HDL Ratio: 4.3 RATIO
VLDL: 22 mg/dL (ref 0–40)

## 2016-10-17 LAB — POCT I-STAT 3, ART BLOOD GAS (G3+)
ACID-BASE DEFICIT: 6 mmol/L — AB (ref 0.0–2.0)
Acid-base deficit: 7 mmol/L — ABNORMAL HIGH (ref 0.0–2.0)
BICARBONATE: 21.1 mmol/L (ref 20.0–28.0)
Bicarbonate: 17.3 mmol/L — ABNORMAL LOW (ref 20.0–28.0)
O2 SAT: 99 %
O2 Saturation: 95 %
PCO2 ART: 42.7 mmHg (ref 32.0–48.0)
PH ART: 7.296 — AB (ref 7.350–7.450)
PO2 ART: 134 mmHg — AB (ref 83.0–108.0)
Patient temperature: 35.5
Patient temperature: 96.8
TCO2: 18 mmol/L (ref 0–100)
TCO2: 22 mmol/L (ref 0–100)
pCO2 arterial: 28 mmHg — ABNORMAL LOW (ref 32.0–48.0)
pH, Arterial: 7.392 (ref 7.350–7.450)
pO2, Arterial: 70 mmHg — ABNORMAL LOW (ref 83.0–108.0)

## 2016-10-17 LAB — BLOOD GAS, ARTERIAL
Acid-base deficit: 5 mmol/L — ABNORMAL HIGH (ref 0.0–2.0)
Bicarbonate: 20.9 mmol/L (ref 20.0–28.0)
Drawn by: 244861
FIO2: 50
O2 Saturation: 98.1 %
PCO2 ART: 48.1 mmHg — AB (ref 32.0–48.0)
PEEP: 5 cmH2O
Patient temperature: 98.6
RATE: 35 resp/min
VT: 530 mL
pH, Arterial: 7.26 — ABNORMAL LOW (ref 7.350–7.450)
pO2, Arterial: 131 mmHg — ABNORMAL HIGH (ref 83.0–108.0)

## 2016-10-17 LAB — CBC
HEMATOCRIT: 40.7 % (ref 39.0–52.0)
Hemoglobin: 13 g/dL (ref 13.0–17.0)
MCH: 25.2 pg — ABNORMAL LOW (ref 26.0–34.0)
MCHC: 31.9 g/dL (ref 30.0–36.0)
MCV: 79 fL (ref 78.0–100.0)
Platelets: 299 10*3/uL (ref 150–400)
RBC: 5.15 MIL/uL (ref 4.22–5.81)
RDW: 14.8 % (ref 11.5–15.5)
WBC: 26.5 10*3/uL — ABNORMAL HIGH (ref 4.0–10.5)

## 2016-10-17 LAB — PROCALCITONIN: Procalcitonin: 3.56 ng/mL

## 2016-10-17 LAB — GLUCOSE, CAPILLARY
GLUCOSE-CAPILLARY: 100 mg/dL — AB (ref 65–99)
GLUCOSE-CAPILLARY: 102 mg/dL — AB (ref 65–99)
GLUCOSE-CAPILLARY: 136 mg/dL — AB (ref 65–99)
GLUCOSE-CAPILLARY: 171 mg/dL — AB (ref 65–99)
Glucose-Capillary: 105 mg/dL — ABNORMAL HIGH (ref 65–99)
Glucose-Capillary: 142 mg/dL — ABNORMAL HIGH (ref 65–99)

## 2016-10-17 LAB — HEPARIN LEVEL (UNFRACTIONATED)
Heparin Unfractionated: 0.49 IU/mL (ref 0.30–0.70)
Heparin Unfractionated: 0.33 IU/mL (ref 0.30–0.70)
Heparin Unfractionated: 0.37 IU/mL (ref 0.30–0.70)
Heparin Unfractionated: 0.31 IU/mL (ref 0.30–0.70)

## 2016-10-17 LAB — LACTIC ACID, PLASMA: LACTIC ACID, VENOUS: 2.1 mmol/L — AB (ref 0.5–1.9)

## 2016-10-17 LAB — COOXEMETRY PANEL
Carboxyhemoglobin: 0.4 % — ABNORMAL LOW (ref 0.5–1.5)
METHEMOGLOBIN: 1.1 % (ref 0.0–1.5)
O2 Saturation: 61.4 %
Total hemoglobin: 13.3 g/dL (ref 12.0–16.0)

## 2016-10-17 LAB — ECHOCARDIOGRAM COMPLETE
HEIGHTINCHES: 65 in
WEIGHTICAEL: 2522.06 [oz_av]

## 2016-10-17 LAB — HEMOGLOBIN A1C
Hgb A1c MFr Bld: 5.7 % — ABNORMAL HIGH (ref 4.8–5.6)
Mean Plasma Glucose: 117 mg/dL

## 2016-10-17 LAB — MAGNESIUM: MAGNESIUM: 1.7 mg/dL (ref 1.7–2.4)

## 2016-10-17 LAB — PHOSPHORUS: PHOSPHORUS: 2.9 mg/dL (ref 2.5–4.6)

## 2016-10-17 MED ORDER — NOREPINEPHRINE BITARTRATE 1 MG/ML IV SOLN
0.0000 ug/min | INTRAVENOUS | Status: DC
  Administered 2016-10-17: 8 ug/min via INTRAVENOUS
  Administered 2016-10-18: 22 ug/min via INTRAVENOUS
  Filled 2016-10-17 (×3): qty 16

## 2016-10-17 MED ORDER — PERFLUTREN LIPID MICROSPHERE
1.0000 mL | INTRAVENOUS | Status: DC | PRN
  Administered 2016-10-17: 4.5 mL via INTRAVENOUS
  Filled 2016-10-17 (×2): qty 10

## 2016-10-17 MED ORDER — DEXTROSE 5 % IV SOLN
1.0000 g | INTRAVENOUS | Status: DC
  Administered 2016-10-17 – 2016-10-19 (×3): 1 g via INTRAVENOUS
  Filled 2016-10-17 (×3): qty 10

## 2016-10-17 MED ORDER — VITAL AF 1.2 CAL PO LIQD
1000.0000 mL | ORAL | Status: DC
  Administered 2016-10-17 – 2016-10-19 (×4): 1000 mL

## 2016-10-17 MED ORDER — VANCOMYCIN HCL IN DEXTROSE 750-5 MG/150ML-% IV SOLN
750.0000 mg | Freq: Two times a day (BID) | INTRAVENOUS | Status: DC
  Administered 2016-10-17 (×2): 750 mg via INTRAVENOUS
  Filled 2016-10-17 (×3): qty 150

## 2016-10-17 MED ORDER — ALBUTEROL SULFATE (2.5 MG/3ML) 0.083% IN NEBU
2.5000 mg | INHALATION_SOLUTION | RESPIRATORY_TRACT | Status: DC | PRN
  Administered 2016-10-17 – 2016-10-18 (×2): 2.5 mg via RESPIRATORY_TRACT
  Filled 2016-10-17 (×2): qty 3

## 2016-10-17 MED ORDER — PRO-STAT SUGAR FREE PO LIQD
30.0000 mL | Freq: Two times a day (BID) | ORAL | Status: DC
  Administered 2016-10-17: 30 mL
  Filled 2016-10-17: qty 30

## 2016-10-17 MED ORDER — POTASSIUM CHLORIDE 10 MEQ/50ML IV SOLN
10.0000 meq | INTRAVENOUS | Status: AC
  Administered 2016-10-17 (×3): 10 meq via INTRAVENOUS
  Filled 2016-10-17 (×3): qty 50

## 2016-10-17 MED ORDER — VITAL HIGH PROTEIN PO LIQD
1000.0000 mL | ORAL | Status: DC
  Administered 2016-10-17: 16:00:00
  Administered 2016-10-17: 1000 mL

## 2016-10-17 MED ORDER — DEXTROSE 5 % IV SOLN
4.0000 mg/h | INTRAVENOUS | Status: DC
  Administered 2016-10-17: 8 mg/h via INTRAVENOUS
  Filled 2016-10-17: qty 25

## 2016-10-17 MED ORDER — PIPERACILLIN-TAZOBACTAM 3.375 G IVPB
3.3750 g | Freq: Three times a day (TID) | INTRAVENOUS | Status: DC
  Filled 2016-10-17: qty 50

## 2016-10-17 MED FILL — Medication: Qty: 1 | Status: AC

## 2016-10-17 NOTE — Progress Notes (Signed)
Progress Note  Patient Name: Gabriel Lopez Date of Encounter: 10/17/2016  Primary Cardiologist: Claiborne Billings  Subjective   Intubated, sedated.   Inpatient Medications    Scheduled Meds: . aspirin  81 mg Oral Daily  . atorvastatin  80 mg Oral q1800  . chlorhexidine gluconate (MEDLINE KIT)  15 mL Mouth Rinse BID  . Chlorhexidine Gluconate Cloth  6 each Topical Daily  . fentaNYL (SUBLIMAZE) injection  50 mcg Intravenous Once  . folic acid  1 mg Intravenous Daily  . insulin aspart  0-9 Units Subcutaneous Q4H  . mouth rinse  15 mL Mouth Rinse 10 times per day  . pantoprazole (PROTONIX) IV  40 mg Intravenous Q24H  . sodium chloride flush  10-40 mL Intracatheter Q12H  . sodium chloride flush  3 mL Intravenous Q12H  . thiamine injection  100 mg Intravenous Daily  . ticagrelor  90 mg Oral BID   Continuous Infusions: . sodium chloride 10 mL/hr at 10/17/16 0800  . sodium chloride    . amiodarone    . amiodarone 30 mg/hr (10/17/16 0701)  . fentaNYL infusion INTRAVENOUS 200 mcg/hr (10/17/16 0746)  . heparin 900 Units/hr (10/17/16 0800)  . norepinephrine (LEVOPHED) Adult infusion     PRN Meds: sodium chloride, acetaminophen, albuterol, docusate, fentaNYL, midazolam, midazolam, nitroGLYCERIN, ondansetron (ZOFRAN) IV, perflutren lipid microspheres (DEFINITY) IV suspension, sodium chloride flush, sodium chloride flush   Vital Signs    Vitals:   10/17/16 0700 10/17/16 0800 10/17/16 0815 10/17/16 0828  BP: 101/84 107/74  107/74  Pulse: 82 84 82 89  Resp: (!) 28 (!) 35 (!) 35   Temp: (!) 96.8 F (36 C) 97.3 F (36.3 C)    TempSrc:  Core (Comment)    SpO2: 100% 97% 100% 99%  Weight:      Height:        Intake/Output Summary (Last 24 hours) at 10/17/16 0950 Last data filed at 10/17/16 0830  Gross per 24 hour  Intake          3755.31 ml  Output             1890 ml  Net          1865.31 ml   Filed Weights   10/16/16 0930  Weight: 157 lb 10.1 oz (71.5 kg)    Telemetry      Sinus with PVCs- Personally Reviewed  ECG    Sinus, RBBB, Anterior Q-waves Personally Reviewed  Physical Exam   GEN: Intubated, sedated, eyes open Neck: No JVD Cardiac: RRR, no murmurs, rubs, or gallops.  Respiratory: Mechanical breath sounds GI: Soft, nontender, non-distended  MS: No edema; No deformity Psych: eyes open  Labs    Chemistry Recent Labs Lab 10/16/16 0652  10/16/16 1436 10/17/16 0200 10/17/16 0430  NA 139  < > 140 137 136  K 3.5  < > 3.7 3.8 4.5  CL 106  < > 108 106 105  CO2 22  --  _0 GLUCOSE 155*  < > 135* 167* 122*  BUN 8  < > _1 CREATININE 1.06  < > 0.94 1.24 1.22  CALCIUM 9.3  --  8.3* 7.9* 7.9*  PROT 7.3  --   --   --   --   ALBUMIN 3.9  --   --   --   --   AST 37  --   --   --   --   ALT 23  --   --   --   --  ALKPHOS 71  --   --   --   --   BILITOT 0.5  --   --   --   --   GFRNONAA >60  --  >60 59* >60  GFRAA >60  --  >60 >60 >60  ANIONGAP 11  --  _0 < > = values in this interval not displayed.   Hematology Recent Labs Lab 10/16/16 0652 10/16/16 0700 10/17/16 0430  WBC 21.0*  --  26.5*  RBC 5.87*  --  5.15  HGB 15.2 16.7 13.0  HCT 46.1 49.0 40.7  MCV 78.5  --  79.0  MCH 25.9*  --  25.2*  MCHC 33.0  --  31.9  RDW 14.3  --  14.8  PLT 377  --  299    Cardiac Enzymes Recent Labs Lab 10/16/16 0652 10/16/16 0949 10/16/16 1133  TROPONINI 0.77* 11.39* 25.67*    Recent Labs Lab 10/16/16 0700  TROPIPOC 0.89*     BNPNo results for input(s): BNP, PROBNP in the last 168 hours.   DDimer No results for input(s): DDIMER in the last 168 hours.   Radiology    Dg Chest Port 1 View  Result Date: 10/17/2016 CLINICAL DATA:  Status post CABG procedure. EXAM: PORTABLE CHEST 1 VIEW COMPARISON:  10/16/2016 FINDINGS: ET tube tip in satisfactory position above the carina. There is a left IJ catheter with tip at the SVC. NG tube tip is in the stomach. Stable position of ensure aortic balloon pump with marker at the level of  the aortic arch. Normal heart size. Mild diffuse edema. IMPRESSION: 1. Stable appearance of support apparatus. 2. Mild diffuse edema. Electronically Signed   By: Kerby Moors M.D.   On: 10/17/2016 07:49   Dg Chest Port 1 View  Result Date: 10/16/2016 CLINICAL DATA:  Status post intubation EXAM: PORTABLE CHEST 1 VIEW COMPARISON:  10/16/2016 FINDINGS: Cardiac shadow is stable. Endotracheal tube, nasogastric catheter and left jugular central line are noted in satisfactory position. Intra-aortic balloon pump is noted just below the aortic knob. These are all new from the prior exam. Vascular congestion is noted with mild pulmonary edema. The overall appearance is similar to that seen on the prior exam. No pneumothorax is noted. IMPRESSION: Changes consistent with CHF stable from the previous study. Tubes and lines as described. Electronically Signed   By: Inez Catalina M.D.   On: 10/16/2016 12:18   Dg Chest Port 1 View  Result Date: 10/16/2016 CLINICAL DATA:  Code STEMI, chest pain. EXAM: PORTABLE CHEST 1 VIEW COMPARISON:  None in PACs FINDINGS: The lungs are adequately inflated. There is no focal infiltrate. The interstitial markings are mildly prominent. The heart is normal in size. The central pulmonary vascularity is prominent. The mediastinum is normal in width. There is no pleural effusion. The observed bony thorax is unremarkable. IMPRESSION: Mild interstitial prominence bilaterally may reflect low-grade pulmonary edema. No cardiomegaly or alveolar pneumonia. Electronically Signed   By: David  Martinique M.D.   On: 10/16/2016 07:16    Cardiac Studies   Cardiac cath 10/16/16:  Ost RCA to Prox RCA lesion, 80 %stenosed.  Mid RCA-1 lesion, 95 %stenosed.  Mid RCA-2 lesion, 100 %stenosed.  A STENT SYNERGY DES 2.5X16 drug eluting stent was successfully placed.  Prox LAD to Mid LAD lesion, 100 %stenosed.  Post intervention, there is a 0% residual stenosis.   Anterior ST segment elevation myocardial  infarction with cardiogenic shock complicated by recurrent episodes of ventricular tachycardia and  polymorphic VT requiring multiple countershocks totaling 11 during the catheterization procedure.  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  Successful PCI to the LAD with PTCA, and ultimate insertion of a 2.516 mm Synergy DES stent postdilated 2.75 mm with the 100% occlusion being reduced to 0% and TIMI 0 flow improved to TIMI 3 flow.  Intra-aortic balloon pump insertion for hemodynamic stability and cardiogenic shock.  Distal aortography demonstrates a normal abdominal aorta, but tortuous with patent renal arteries.  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  Patient Profile     66 y.o. male admitted with out of hospital VF arrest and found to have acute anterior STEMI secondary to occluded ostial LAD, now s/p DES x 1 in the LAD. Complicated by VT arrest with 11 shocks.   Assessment & Plan    1. Out of hospital cardiac arrest/acute anterior STEMI/cardiogenic shock: Pt admitted following out of hospital arrest. His LAD was occluded and treated with a drug eluting stent. He also has a chronic occlusion of the RCA. Echo is pending this am to assess LV function. He is still requiring Levophed and IABP support with ongoing cardiogenic shock. CO-OX pending this am. May need to involve CHF team. Will continue ASA, Brilinta, statin.   2. Non-sustained VT: Will continue IV amiodarone for now  3. VDRF: PCCM is following. Continue mechanical ventilation for now  Signed, Lauree Chandler, MD  10/17/2016, 9:50 AM

## 2016-10-17 NOTE — Progress Notes (Signed)
Boyce Medici notified of lower blood pressures on lasix drip.  Will continue to monitor pt closely, and will notify elink MD.

## 2016-10-17 NOTE — Progress Notes (Signed)
Dr. Vaughan Basta notified of dropping blood pressures requiring increasing levophed.  Patient on lasix drip at 8mg /hour.  Orders received to decrease lasix drip to 4 mg/hour.  Will continue to monitor pt closely.

## 2016-10-17 NOTE — Progress Notes (Signed)
Initial Nutrition Assessment  INTERVENTION:   D/C Prostat and Vital High Protein  Vital AF 1.2 @ 65 ml/hr (1560 ml/day) Provides: 1872 kcal, 117 grams protein, and 1265 ml free water.   NUTRITION DIAGNOSIS:   Inadequate oral intake related to inability to eat as evidenced by NPO status.  GOAL:   Patient will meet greater than or equal to 90% of their needs  MONITOR:   TF tolerance, Vent status  REASON FOR ASSESSMENT:   Consult Enteral/tube feeding initiation and management  ASSESSMENT:   Pt with unknown medical hx (has never seen a doctor) admitted with STEMI and cardiac arrest s/p cath lab with stents x 2 for LAD occulusion, placed on IABP and vasopressors for cardiogenic shock. Pt follows commands and therefore is not on hypothermia protocol.    No findings on physical exam. Pt nods no to weight loss PTA.  Adult TF protocol started this am.  Patient is currently intubated on ventilator support MV: 17.6 L/min Temp (24hrs), Avg:96.4 F (35.8 C), Min:89.1 F (31.7 C), Max:98.6 F (37 C)  Medications reviewed and include: folic acid and thiamine  Diet Order:  Diet NPO time specified  Skin:  Reviewed, no issues  Last BM:  unknown  Height:   Ht Readings from Last 1 Encounters:  10/17/16 5\' 5"  (1.651 m)    Weight:   Wt Readings from Last 1 Encounters:  10/16/16 157 lb 10.1 oz (71.5 kg)    Ideal Body Weight:  61.8 kg  BMI:  Body mass index is 26.23 kg/m.  Estimated Nutritional Needs:   Kcal:  1882  Protein:  100-115 grams  Fluid:  > 1.9 L/day  EDUCATION NEEDS:   No education needs identified at this time  Kendell Bane RD, LDN, CNSC [phone] Pager [phone] After Hours Pager

## 2016-10-17 NOTE — Progress Notes (Signed)
eLink Physician-Brief Progress Note Patient Name: Gabriel Lopez DOB: 1951/02/14 MRN: [id number]   Date of Service  10/17/2016  HPI/Events of Note  Hypotension - BP = 89/51 w/MAP = 63 on Lasix and Norepinephrine IV infusions. Urine output increased on Lasix IV infusion.   eICU Interventions  Will decrease Lasix IV infusion to 4 mg/hour.      Intervention Category Intermediate Interventions: Hypotension - evaluation and management  Gladyce Mcray Eugene 10/17/2016, 6:21 PM

## 2016-10-17 NOTE — Progress Notes (Signed)
eLink Physician-Brief Progress Note Patient Name: Gabriel Lopez DOB: Nov 07, 1950 MRN: [id number]   Date of Service  10/17/2016  HPI/Events of Note  K+ = 3.2 and Creatinine = 1.18.  eICU Interventions  Will replace K+.      Intervention Category Major Interventions: Electrolyte abnormality - evaluation and management  Lenell Antu 10/17/2016, 6:37 PM

## 2016-10-17 NOTE — Progress Notes (Signed)
ANTICOAGULATION CONSULT NOTE   Pharmacy Consult for Heparin Indication: IABP  No Known Allergies  Patient Measurements: Height: 5' 5"  (165.1 cm) Weight: 157 lb 3 oz (71.3 kg) (per bed history) IBW/kg (Calculated) : 61.5 Heparin Dosing Weight: 71.5kg  Vital Signs: Temp: 100.2 F (37.9 C) (05/02 1743) Temp Source: Axillary (05/02 1743) BP: 89/51 (05/02 1807) Pulse Rate: 93 (05/02 1845)  Labs:  Recent Labs  10/16/16 0652 10/16/16 0700 10/16/16 0949 10/16/16 1133  10/16/16 1458  10/17/16 0430 10/17/16 1207 10/17/16 1208 10/17/16 1731  HGB 15.2 16.7  --   --   --   --   --  13.0  --   --   --   HCT 46.1 49.0  --   --   --   --   --  40.7  --   --   --   PLT 377  --   --   --   --   --   --  299  --   --   --   APTT 30  --   --   --   --   --   --   --   --   --   --   LABPROT 12.8  --   --   --   --  19.1*  --   --   --   --   --   INR 0.96  --   --   --   --  1.59  --   --   --   --   --   HEPARINUNFRC  --   --   --   --   --   --   < > 0.49 0.33  --  0.37  CREATININE 1.06 1.00  --   --   < >  --   < > 1.22  --  1.13 1.18  TROPONINI 0.77*  --  11.39* 25.67*  --   --   --   --   --   --   --   < > = values in this interval not displayed.  Estimated Creatinine Clearance: 53.6 mL/min (by C-G formula based on SCr of 1.18 mg/dL).   Medical History: No past medical history on file.  Medications:  Prescriptions Prior to Admission  Medication Sig Dispense Refill Last Dose  . aspirin EC 81 MG tablet Take 81 mg by mouth daily.   10/16/2016 at Unknown  . ranitidine (ZANTAC) 75 MG tablet Take 150 mg by mouth daily as needed for heartburn.   Unknown at Unknown   Scheduled:  . aspirin  81 mg Oral Daily  . atorvastatin  80 mg Oral q1800  . chlorhexidine gluconate (MEDLINE KIT)  15 mL Mouth Rinse BID  . Chlorhexidine Gluconate Cloth  6 each Topical Daily  . fentaNYL (SUBLIMAZE) injection  50 mcg Intravenous Once  . folic acid  1 mg Intravenous Daily  . insulin aspart  0-9  Units Subcutaneous Q4H  . mouth rinse  15 mL Mouth Rinse 10 times per day  . pantoprazole (PROTONIX) IV  40 mg Intravenous Q24H  . sodium chloride flush  10-40 mL Intracatheter Q12H  . sodium chloride flush  3 mL Intravenous Q12H  . thiamine injection  100 mg Intravenous Daily  . ticagrelor  90 mg Oral BID   Infusions:  . sodium chloride 10 mL/hr at 10/17/16 0800  . sodium chloride    . amiodarone    .  amiodarone 30 mg/hr (10/17/16 1024)  . cefTRIAXone (ROCEPHIN)  IV Stopped (10/17/16 1335)  . feeding supplement (VITAL AF 1.2 CAL) 1,000 mL (10/17/16 1625)  . fentaNYL infusion INTRAVENOUS 200 mcg/hr (10/17/16 0815)  . furosemide (LASIX) infusion 4 mg/hr (10/17/16 1818)  . heparin 900 Units/hr (10/17/16 1819)  . norepinephrine (LEVOPHED) Adult infusion 25 mcg/min (10/17/16 1818)  . potassium chloride 10 mEq (10/17/16 1844)  . vancomycin Stopped (10/17/16 1253)    Assessment: Patient is a 66 year old male admitted 10/16/16 as code STEMI s/p VT and VF w/ CPR in the ED on arrival. In cath, he was stented x2 to LAD and had IABP placed.  Heparin to continue while IABP in use.  Confirmatory heparin level therapeutic at 0.37 units/mL on 900 units/hr. Hemoglobin and platelet count are within normal limits. No bleeding complications noted.   Patient now rewarming, was febrile at last temp check.  Goal of Therapy:  Heparin level 0.2-0.5 units/ml Monitor platelets by anticoagulation protocol: Yes   Plan:  Continue heparin 900 units/hr IV. Confirm heparin level in 4 hrs. Daily heparin level and CBC  Uvaldo Rising, BCPS  Clinical Pharmacist Pager [phone]  10/17/2016 6:51 PM

## 2016-10-17 NOTE — Care Management Note (Addendum)
Case Management Note Donn Pierini RN, BSN Unit 2W-Case Manager-- 2H coverage [phone]  Patient Details  Name: Devarus Hubel MRN: [id number] Date of Birth: [birth year]  Subjective/Objective:  Pt  admitted with out of hospital VF arrest and found to have acute anterior STEMI secondary to occluded ostial LAD, now s/p DES x 1 in the LAD. Complicated by VT arrest with 11 shocks.  Remains on vent support- 10/17/16                   Action/Plan: PTA pt lived at home- CM to follow for d/c needs, noted plan for Brilinta- will submit for benefits check regarding copay cost-- will need to check with pt once off vent regarding prescription coverage- pt will need 30 day free card for Brilinta prior to discharge.   Expected Discharge Date:                  Expected Discharge Plan:    unknown  In-House Referral:     Discharge planning Services  CM Consult, Medication Assistance  Post Acute Care Choice:    Choice offered to:     DME Arranged:    DME Agency:     HH Arranged:    HH Agency:     Status of Service:  In process, will continue to follow  If discussed at Long Length of Stay Meetings, dates discussed:    Discharge Disposition:   Additional Comments:  Darrold Span, RN 10/17/2016, 10:39 AM

## 2016-10-17 NOTE — Progress Notes (Addendum)
ANTICOAGULATION CONSULT NOTE - Initial Consult  Pharmacy Consult for Heparin Indication: IABP  No Known Allergies  Patient Measurements: Height: 5' 5"  (165.1 cm) Weight: 157 lb 10.1 oz (71.5 kg) IBW/kg (Calculated) : 61.5 Heparin Dosing Weight: 71.5kg  Vital Signs: Temp: 97.3 F (36.3 C) (05/02 0800) Temp Source: Core (Comment) (05/02 0800) BP: 107/74 (05/02 0828) Pulse Rate: 89 (05/02 0828)  Labs:  Recent Labs  10/16/16 0652 10/16/16 0700 10/16/16 0949 10/16/16 1133 10/16/16 1436 10/16/16 1458 10/16/16 2057 10/17/16 0200 10/17/16 0430  HGB 15.2 16.7  --   --   --   --   --   --  13.0  HCT 46.1 49.0  --   --   --   --   --   --  40.7  PLT 377  --   --   --   --   --   --   --  299  APTT 30  --   --   --   --   --   --   --   --   LABPROT 12.8  --   --   --   --  19.1*  --   --   --   INR 0.96  --   --   --   --  1.59  --   --   --   HEPARINUNFRC  --   --   --   --   --   --  0.19*  --  0.49  CREATININE 1.06 1.00  --   --  0.94  --   --  1.24 1.22  TROPONINI 0.77*  --  11.39* 25.67*  --   --   --   --   --     Estimated Creatinine Clearance: 51.8 mL/min (by C-G formula based on SCr of 1.22 mg/dL).   Medical History: No past medical history on file.  Medications:  Prescriptions Prior to Admission  Medication Sig Dispense Refill Last Dose  . aspirin EC 81 MG tablet Take 81 mg by mouth daily.   10/16/2016 at Unknown  . ranitidine (ZANTAC) 75 MG tablet Take 150 mg by mouth daily as needed for heartburn.   Unknown at Unknown   Scheduled:  . aspirin  81 mg Oral Daily  . atorvastatin  80 mg Oral q1800  . chlorhexidine gluconate (MEDLINE KIT)  15 mL Mouth Rinse BID  . Chlorhexidine Gluconate Cloth  6 each Topical Daily  . fentaNYL (SUBLIMAZE) injection  50 mcg Intravenous Once  . folic acid  1 mg Intravenous Daily  . insulin aspart  0-9 Units Subcutaneous Q4H  . mouth rinse  15 mL Mouth Rinse 10 times per day  . pantoprazole (PROTONIX) IV  40 mg Intravenous Q24H   . sodium chloride flush  10-40 mL Intracatheter Q12H  . sodium chloride flush  3 mL Intravenous Q12H  . thiamine injection  100 mg Intravenous Daily  . ticagrelor  90 mg Oral BID   Infusions:  . sodium chloride 10 mL/hr at 10/17/16 0800  . sodium chloride    . amiodarone    . amiodarone 30 mg/hr (10/17/16 0701)  . fentaNYL infusion INTRAVENOUS 200 mcg/hr (10/17/16 0746)  . heparin 900 Units/hr (10/17/16 0800)  . norepinephrine (LEVOPHED) Adult infusion      Assessment: Patient is a 66 year old male admitted 10/16/16 as code STEMI s/p VT and VF w/ CPR in the ED on arrival. In cath, he was  stented x2 to LAD and had IABP placed.  Heparin to continue while IABP in use.  Heparin level therapeutic this morning at 0.49 units/mL on 900 units/hr. Hemoglobin and platelet count are within normal limits. No bleeding complications noted.   Goal of Therapy:  Heparin level 0.2-0.5 units/ml Monitor platelets by anticoagulation protocol: Yes   Plan:  Continue heparin 900 units/hr. Heparin level in 6 hours to confirm  Daily heparin level and CBC  Demetrius Charity, PharmD Acute Care Pharmacy Resident  Pager: [phone] 10/17/2016

## 2016-10-17 NOTE — Progress Notes (Signed)
ANTICOAGULATION CONSULT NOTE - Initial Consult  Pharmacy Consult for Heparin Indication: IABP  No Known Allergies  Patient Measurements: Height: 5' 5"  (165.1 cm) Weight: 157 lb 10.1 oz (71.5 kg) IBW/kg (Calculated) : 61.5 Heparin Dosing Weight: 71.5kg  Vital Signs: Temp: 90.3 F (32.4 C) (05/02 1300) Temp Source: Oral (05/02 1126) BP: 92/59 (05/02 1300) Pulse Rate: 84 (05/02 1300)  Labs:  Recent Labs  10/16/16 0652 10/16/16 0700 10/16/16 0949 10/16/16 1133  10/16/16 1458 10/16/16 2057 10/17/16 0200 10/17/16 0430 10/17/16 1207 10/17/16 1208  HGB 15.2 16.7  --   --   --   --   --   --  13.0  --   --   HCT 46.1 49.0  --   --   --   --   --   --  40.7  --   --   PLT 377  --   --   --   --   --   --   --  299  --   --   APTT 30  --   --   --   --   --   --   --   --   --   --   LABPROT 12.8  --   --   --   --  19.1*  --   --   --   --   --   INR 0.96  --   --   --   --  1.59  --   --   --   --   --   HEPARINUNFRC  --   --   --   --   --   --  0.19*  --  0.49 0.33  --   CREATININE 1.06 1.00  --   --   < >  --   --  1.24 1.22  --  1.13  TROPONINI 0.77*  --  11.39* 25.67*  --   --   --   --   --   --   --   < > = values in this interval not displayed.  Estimated Creatinine Clearance: 55.9 mL/min (by C-G formula based on SCr of 1.13 mg/dL).   Medical History: No past medical history on file.  Medications:  Prescriptions Prior to Admission  Medication Sig Dispense Refill Last Dose  . aspirin EC 81 MG tablet Take 81 mg by mouth daily.   10/16/2016 at Unknown  . ranitidine (ZANTAC) 75 MG tablet Take 150 mg by mouth daily as needed for heartburn.   Unknown at Unknown   Scheduled:  . aspirin  81 mg Oral Daily  . atorvastatin  80 mg Oral q1800  . chlorhexidine gluconate (MEDLINE KIT)  15 mL Mouth Rinse BID  . Chlorhexidine Gluconate Cloth  6 each Topical Daily  . feeding supplement (PRO-STAT SUGAR FREE 64)  30 mL Per Tube BID  . feeding supplement (VITAL HIGH PROTEIN)   1,000 mL Per Tube Q24H  . fentaNYL (SUBLIMAZE) injection  50 mcg Intravenous Once  . folic acid  1 mg Intravenous Daily  . insulin aspart  0-9 Units Subcutaneous Q4H  . mouth rinse  15 mL Mouth Rinse 10 times per day  . pantoprazole (PROTONIX) IV  40 mg Intravenous Q24H  . sodium chloride flush  10-40 mL Intracatheter Q12H  . sodium chloride flush  3 mL Intravenous Q12H  . thiamine injection  100 mg Intravenous Daily  . ticagrelor  90 mg Oral BID   Infusions:  . sodium chloride 10 mL/hr at 10/17/16 0800  . sodium chloride    . amiodarone    . amiodarone 30 mg/hr (10/17/16 1024)  . cefTRIAXone (ROCEPHIN)  IV 1 g (10/17/16 1305)  . fentaNYL infusion INTRAVENOUS 200 mcg/hr (10/17/16 0815)  . furosemide (LASIX) infusion 8 mg/hr (10/17/16 1206)  . heparin 900 Units/hr (10/17/16 0800)  . norepinephrine (LEVOPHED) Adult infusion 8 mcg/min (10/17/16 1035)  . vancomycin Stopped (10/17/16 1253)    Assessment: Patient is a 66 year old male admitted 10/16/16 as code STEMI s/p VT and VF w/ CPR in the ED on arrival. In cath, he was stented x2 to LAD and had IABP placed.  Heparin to continue while IABP in use.  Confirmatory heparin level therapeutic at 0.33 units/mL on 900 units/hr. Hemoglobin and platelet count are within normal limits. No bleeding complications noted.   Since patient's TTM has been turned off, will get another heparin level to ensure it doesn't change too much.  Goal of Therapy:  Heparin level 0.2-0.5 units/ml Monitor platelets by anticoagulation protocol: Yes   Plan:  Continue heparin 900 units/hr IV 6-hour heparin level Daily heparin level and CBC  Demetrius Charity, PharmD Acute Care Pharmacy Resident  Pager: [phone] 10/17/2016

## 2016-10-17 NOTE — Progress Notes (Signed)
Elink RN Magda Paganini notified of potassium level 3.2.  Will continue to monitor pt closely.

## 2016-10-17 NOTE — Progress Notes (Signed)
CRITICAL VALUE ALERT  Critical value received:  Lactic Acid 2.1   Date of notification:  10/17/16  Time of notification:  0510  Critical value read back:Yes.    Nurse who received alert:  Hillery Jacks, RN  MD notified (1st page):  Dr. Thornton Dales MD  Time of first page:  [phone]  Responding MD:  Dr. Thornton Dales MD  Time MD responded:  [phone]

## 2016-10-17 NOTE — Progress Notes (Signed)
PULMONARY / CRITICAL CARE MEDICINE   Name: Gabriel Lopez MRN: [id number] DOB: 1950-09-21    ADMISSION DATE:  10/16/2016 CONSULTATION DATE:  10/16/2016  REFERRING MD:  Dr. Tresa Endo  CHIEF COMPLAINT:  Cardiac Arrest  HISTORY OF PRESENT ILLNESS:   Information obtained from thorough medical review since the patient is currently intubated and sedated.    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  He presented to the ED this am with complaints of progressively worse SSCP since 0400 with associated diaphoresis, nausea, and vomiting.  Per the family, he has not been feeling well for several days per his family.  In the ED, EKG showed an anterior STEMI.   He went into VF to ?Torsades arrest, treated with CPR, epi, defibrillated 5 times, and amiodarone for 10 mins prior to ROSC,  Afterwards, he became uncooperative and verbally belligerent and therefore intubated for airway protection and taken to the Cath lab.    In the cath lab, he was stented x 2 for LAD occulusion, placed on IABP and vasopressors for cardiogenic shock.  He was intermittently agitated during procedure.  PCCM consulted.     SUBJECTIVE:  Sedated and intubated; though is able to follow all commands.   VITAL SIGNS: BP 107/74 (BP Location: Left Arm)   Pulse 82   Temp 97.3 F (36.3 C) (Core (Comment))   Resp (!) 35   Ht 5\' 5"  (1.651 m)   Wt 71.5 kg (157 lb 10.1 oz)   SpO2 100%   BMI 26.23 kg/m   HEMODYNAMICS: CVP:  [15 mmHg-18 mmHg] 15 mmHg  VENTILATOR SETTINGS: Vent Mode: PRVC FiO2 (%):  [50 %] 50 % Set Rate:  [22 bmp-35 bmp] 35 bmp Vt Set:  [490 mL-590 mL] 490 mL PEEP:  [5 cmH20-8 cmH20] 8 cmH20 Plateau Pressure:  [14 cmH20-28 cmH20] 24 cmH20  INTAKE / OUTPUT: I/O last 3 completed shifts: In: 3677.1 [I.V.:3577.1; NG/GT:100] Out: 1845 [Urine:1845]  PHYSICAL EXAMINATION: General:  Older than stated age adult male sedated on  MV HEENT: MM pink/moist, ETT, multiple facial piercings  Neuro: Sedated, follows commands CV: s1s2 rrr, mechanical sounds from IABP PULM: even/non-labored on MV, lungs bilaterally clear, diminished in the bases IO:NGEX, non-tender, mechanical sounds, bilateral inguinal hernia Extremities: warm/dry, no edema  Skin: no rashes or lesions, multiple tattoos    LABS:  BMET  Recent Labs Lab 10/16/16 1436 10/17/16 0200 10/17/16 0430  NA 140 137 136  K 3.7 3.8 4.5  CL 108 106 105  CO2 25 22 23   BUN 8 11 9   CREATININE 0.94 1.24 1.22  GLUCOSE 135* 167* 122*    Electrolytes  Recent Labs Lab 10/16/16 0949 10/16/16 1436 10/17/16 0200 10/17/16 0430  CALCIUM  --  8.3* 7.9* 7.9*  MG 2.3  --   --   --     CBC  Recent Labs Lab 10/16/16 0652 10/16/16 0700 10/17/16 0430  WBC 21.0*  --  26.5*  HGB 15.2 16.7 13.0  HCT 46.1 49.0 40.7  PLT 377  --  299    Coag's  Recent Labs Lab 10/16/16 0652 10/16/16 1458  APTT 30  --   INR 0.96 1.59    Sepsis Markers  Recent Labs Lab 10/16/16 1133 10/16/16 1436 10/16/16 2057 10/17/16 0430  LATICACIDVEN  --  2.6* 2.5* 2.1*  PROCALCITON 0.24  --   --  3.56    ABG  Recent Labs Lab 10/16/16 2227 10/17/16 0007 10/17/16 0350  PHART 7.155* 7.392 7.296*  PCO2ART 60.8* 28.0* 42.7  PO2ART 61.0* 70.0* 134.0*    Liver Enzymes  Recent Labs Lab 10/16/16 0652  AST 37  ALT 23  ALKPHOS 71  BILITOT 0.5  ALBUMIN 3.9    Cardiac Enzymes  Recent Labs Lab 10/16/16 0652 10/16/16 0949 10/16/16 1133  TROPONINI 0.77* 11.39* 25.67*    Glucose  Recent Labs Lab 10/16/16 1640 10/16/16 1918 10/16/16 2334 10/17/16 0359 10/17/16 0818  GLUCAP 142* 134* 165* 102* 105*    Imaging Dg Chest Port 1 View  Result Date: 10/17/2016 CLINICAL DATA:  Status post CABG procedure. EXAM: PORTABLE CHEST 1 VIEW COMPARISON:  10/16/2016 FINDINGS: ET tube tip in satisfactory position above the carina. There is a left IJ catheter with tip  at the SVC. NG tube tip is in the stomach. Stable position of ensure aortic balloon pump with marker at the level of the aortic arch. Normal heart size. Mild diffuse edema. IMPRESSION: 1. Stable appearance of support apparatus. 2. Mild diffuse edema. Electronically Signed   By: Signa Kell M.D.   On: 10/17/2016 07:49   Dg Chest Port 1 View  Result Date: 10/16/2016 CLINICAL DATA:  Status post intubation EXAM: PORTABLE CHEST 1 VIEW COMPARISON:  10/16/2016 FINDINGS: Cardiac shadow is stable. Endotracheal tube, nasogastric catheter and left jugular central line are noted in satisfactory position. Intra-aortic balloon pump is noted just below the aortic knob. These are all new from the prior exam. Vascular congestion is noted with mild pulmonary edema. The overall appearance is similar to that seen on the prior exam. No pneumothorax is noted. IMPRESSION: Changes consistent with CHF stable from the previous study. Tubes and lines as described. Electronically Signed   By: Alcide Clever M.D.   On: 10/16/2016 12:18     STUDIES:  EKG 5/1 > anterior STEMI Cardiac Cath 5/1 > RCA 80% stenosed, mid RCA lesion one 95% stenosed, mid RCA lesion two 100% stenosed, prox LAD to mid LAD lesion 100% stenosed > DES placed, IABP placed. PCXR 5/1 > CHF. Echo 5/2 >   CULTURES: BC x 2 5/1 > [place] 5/1 >  ANTIBIOTICS:  SIGNIFICANT EVENTS: 5/1 > Admit STEMI/ Cardiac arrest/ cath lab stent x 2/ IABP  LINES/TUBES: ETT 5/1 R sheath 5/1 L IJ CVL 5/1 >   DISCUSSION: 58 yoM admitted with anterior STEMI, progressed to VF/ ? torsedes arrest,  epi, shocked 5 times, amio/lido, CPR x 10 mins before ROSC.  Intubated afterwards airway protection and combativeness.  Taken to the Cath lab, stented x 2 to LAD and IABP placed.    ASSESSMENT / PLAN:  PULMONARY A: Acute Respiratory Failure - in the setting of STEMI/ Cardiac Arrest Respiratory acidosis - vent settings adjusted 5/1 though still slightly acidemic P:   Increase Vt  to 530 (~8.5cc/kg), drop PEEP back to 5, FiO2 50% Repeat ABG VAP protocol Daily SBT No plans for extubation given continued IABP support, pressors, etc Daily CXR  CARDIOVASCULAR A:  Anterior STEMI - s/p 2 stents to LAD VF Cardiac Arrest - ? Torsades - down 10 min prior to ROSC  Cardiogenic Shock - IABP 5/1 P:  D/c normothermia given normal mental status and ability to follow commands Continue IABP per Cards Assess co-ox, echo Continue levophed, goal MAP > 65 (change levo to quad strength) Continue Amio gtt, ASA, brilinta per Cards  RENAL A:   ? Hypocalcemia - no albumin to correct for P:  Assess ionized Ca Goal Mg > 2, K > 4 Replace electrolytes as indicated BMP daily  GASTROINTESTINAL A:   GI prophylaxis P:   SUP: Pantoprazole  NPO Start TF's  HEMATOLOGIC A:   VTE prophylaxis P:   SCD's / Heparin CBC daily  INFECTIOUS A:   Leukocytosis- initially thought to be reactive; however slight rise this AM and PCT 3.6. P:   Start empiric abx (vanc / zosyn). Follow cultures. Follow PCT algorithm.  ENDOCRINE A:   No acute issues  P:   SSI if glucose consistently > 180  NEUROLOGIC A:   Acute encephalopathy- in the setting of cardiac arrest  P:   RASS goal: 0 to -1 Fentanyl gtt / versed for PAD protocol Daily WUA Continue thiamine / folic acid  FAMILY  - Updates: Son in law updated at bedside.  - Inter-disciplinary family meet or Palliative Care meeting due by:  Oct 23, 2016  CC time: 30 mins    Rutherford Guys, [place] [facility] Pager: [phone]  or [phone] 10/17/2016, 8:54 AM   STAFF NOTE: I, Rory Percy, MD FACP have personally reviewed patient's available data, including medical history, events of note, physical examination and test results as part of my evaluation. I have discussed with resident/NP and other care providers such as pharmacist, RN and RRT. In addition, I personally evaluated patient  and elicited key findings of: awake, fc, nonfocal, perrl, lungs crackles, I reviewed all films which show pulm edema and ecg with prior stemi, he is worse with ventilation which I feel is from worsening pulm edema, r/o aspiration with pct rise and water temp correction needs, plan is increase T V to 8 cc/kg, NO ARDS, peep back to 5, adding lasix infusion in settig of shock, goal ne 2 liters, lytes in pm, levophed to map goals, cvp 16, adding abx, ctx sending sputum if able, BC ensured one, pct in am, feed pt today, now as following commands can consider dc normothermia, I updated family in full myself and took history further, no SBT able, WUA mandatory The patient is critically ill with multiple organ systems failure and requires high complexity decision making for assessment and support, frequent evaluation and titration of therapies, application of advanced monitoring technologies and extensive interpretation of multiple databases.   Critical Care Time devoted to patient care services described in this note is 35 Minutes. This time reflects time of care of this signee: Rory Percy, MD FACP. This critical care time does not reflect procedure time, or teaching time or supervisory time of PA/NP/Med student/Med Resident etc but could involve care discussion time. Rest per NP/medical resident whose note is outlined above and that I agree with   Mcarthur Rossetti. Tyson Alias, MD, FACP Pgr: [phone] [place] [facility] 10/17/2016 10:52 AM

## 2016-10-17 NOTE — Progress Notes (Signed)
Pharmacy Antibiotic Note  Gabriel Lopez is a 66 y.o. male admitted on 10/16/2016 with leukocytosis of unknown origin. Pharmacy has been consulted for vancomycin dosing.  Patient is hypothermic (on TTM), has elevated WBC to 26.5, and elevated lactic acid. His serum creatinine is also elevated above baseline, with CrCL ~52 mL/min. He is also on a furosemide drip and norepinephrine as concurrent nephrotoxins.   Plan: Vancomycin 750mg  IV every 12 hours Ceftriaxone 1gm IV every 24 hours Follow up trach aspirate culture Monitor renal function closely  Height: 5\' 5"  (165.1 cm) Weight: 157 lb 10.1 oz (71.5 kg) IBW/kg (Calculated) : 61.5  Temp (24hrs), Avg:97 F (36.1 C), Min:93.7 F (34.3 C), Max:98.6 F (37 C)   Recent Labs Lab 10/16/16 0652 10/16/16 0700 10/16/16 1436 10/16/16 2057 10/17/16 0200 10/17/16 0430  WBC 21.0*  --   --   --   --  26.5*  CREATININE 1.06 1.00 0.94  --  1.24 1.22  LATICACIDVEN  --   --  2.6* 2.5*  --  2.1*    Estimated Creatinine Clearance: 51.8 mL/min (by C-G formula based on SCr of 1.22 mg/dL).    No Known Allergies  Antimicrobials this admission: 5/2 ceftriaxone >>  5/2 vancomycin >>   Microbiology results: 5/1 BCx: NG < 24 hours 5/1 trach aspirate: moderate GPCs in pairs, few GPRs, rare GNRs  5/1 MRSA PCR: negative  Thank you for allowing pharmacy to be a part of this patient's care.  Carylon Perches, PharmD Acute Care Pharmacy Resident  Pager: [phone] 10/17/2016

## 2016-10-17 NOTE — Progress Notes (Signed)
  Echocardiogram 2D Echocardiogram with Definity has been performed.  Leta Jungling M 10/17/2016, 9:51 AM

## 2016-10-18 ENCOUNTER — Inpatient Hospital Stay (HOSPITAL_COMMUNITY): Payer: Medicare Other

## 2016-10-18 LAB — HEPARIN LEVEL (UNFRACTIONATED): HEPARIN UNFRACTIONATED: 0.31 [IU]/mL (ref 0.30–0.70)

## 2016-10-18 LAB — CBC
HCT: 34.1 % — ABNORMAL LOW (ref 39.0–52.0)
Hemoglobin: 11.4 g/dL — ABNORMAL LOW (ref 13.0–17.0)
MCH: 25.1 pg — AB (ref 26.0–34.0)
MCHC: 33.4 g/dL (ref 30.0–36.0)
MCV: 74.9 fL — ABNORMAL LOW (ref 78.0–100.0)
PLATELETS: 234 10*3/uL (ref 150–400)
RBC: 4.55 MIL/uL (ref 4.22–5.81)
RDW: 14.3 % (ref 11.5–15.5)
WBC: 20.3 10*3/uL — ABNORMAL HIGH (ref 4.0–10.5)

## 2016-10-18 LAB — BASIC METABOLIC PANEL
ANION GAP: 11 (ref 5–15)
Anion gap: 11 (ref 5–15)
Anion gap: 8 (ref 5–15)
BUN: 16 mg/dL (ref 6–20)
BUN: 14 mg/dL (ref 6–20)
BUN: 11 mg/dL (ref 6–20)
CALCIUM: 7.4 mg/dL — AB (ref 8.9–10.3)
CALCIUM: 7.2 mg/dL — AB (ref 8.9–10.3)
CHLORIDE: 98 mmol/L — AB (ref 101–111)
CO2: 20 mmol/L — ABNORMAL LOW (ref 22–32)
CO2: 20 mmol/L — ABNORMAL LOW (ref 22–32)
CO2: 26 mmol/L (ref 22–32)
CREATININE: 1.05 mg/dL (ref 0.61–1.24)
CREATININE: 1.03 mg/dL (ref 0.61–1.24)
Calcium: 7.1 mg/dL — ABNORMAL LOW (ref 8.9–10.3)
Chloride: 102 mmol/L (ref 101–111)
Chloride: 101 mmol/L (ref 101–111)
Creatinine, Ser: 1.1 mg/dL (ref 0.61–1.24)
GFR calc Af Amer: >60 mL/min (ref >60)
GLUCOSE: 158 mg/dL — AB (ref 65–99)
Glucose, Bld: 173 mg/dL — ABNORMAL HIGH (ref 65–99)
Glucose, Bld: 152 mg/dL — ABNORMAL HIGH (ref 65–99)
POTASSIUM: 3.2 mmol/L — AB (ref 3.5–5.1)
POTASSIUM: 3.3 mmol/L — AB (ref 3.5–5.1)
Potassium: 2.8 mmol/L — ABNORMAL LOW (ref 3.5–5.1)
SODIUM: 132 mmol/L — AB (ref 135–145)
SODIUM: 132 mmol/L — AB (ref 135–145)
Sodium: 133 mmol/L — ABNORMAL LOW (ref 135–145)

## 2016-10-18 LAB — POCT I-STAT 3, ART BLOOD GAS (G3+)
ACID-BASE DEFICIT: 1 mmol/L (ref 0.0–2.0)
Acid-Base Excess: 2 mmol/L (ref 0.0–2.0)
BICARBONATE: 22.2 mmol/L (ref 20.0–28.0)
BICARBONATE: 24.5 mmol/L (ref 20.0–28.0)
O2 SAT: 99 %
O2 Saturation: 97 %
PCO2 ART: 29.9 mmHg — AB (ref 32.0–48.0)
PH ART: 7.519 — AB (ref 7.350–7.450)
PO2 ART: 114 mmHg — AB (ref 83.0–108.0)
Patient temperature: 97.8
TCO2: 23 mmol/L (ref 0–100)
TCO2: 25 mmol/L (ref 0–100)
pCO2 arterial: 31.3 mmHg — ABNORMAL LOW (ref 32.0–48.0)
pH, Arterial: 7.46 — ABNORMAL HIGH (ref 7.350–7.450)
pO2, Arterial: 85 mmHg (ref 83.0–108.0)

## 2016-10-18 LAB — GLUCOSE, CAPILLARY
GLUCOSE-CAPILLARY: 130 mg/dL — AB (ref 65–99)
GLUCOSE-CAPILLARY: 145 mg/dL — AB (ref 65–99)
GLUCOSE-CAPILLARY: 166 mg/dL — AB (ref 65–99)
Glucose-Capillary: 164 mg/dL — ABNORMAL HIGH (ref 65–99)
Glucose-Capillary: 156 mg/dL — ABNORMAL HIGH (ref 65–99)
Glucose-Capillary: 142 mg/dL — ABNORMAL HIGH (ref 65–99)

## 2016-10-18 LAB — MAGNESIUM
MAGNESIUM: 1.8 mg/dL (ref 1.7–2.4)
MAGNESIUM: 2.4 mg/dL (ref 1.7–2.4)
Magnesium: 2 mg/dL (ref 1.7–2.4)

## 2016-10-18 LAB — COOXEMETRY PANEL
CARBOXYHEMOGLOBIN: 0.6 % (ref 0.5–1.5)
Methemoglobin: 1.1 % (ref 0.0–1.5)
O2 Saturation: 67 %
Total hemoglobin: 11.8 g/dL — ABNORMAL LOW (ref 12.0–16.0)

## 2016-10-18 LAB — CALCIUM, IONIZED: Calcium, Ionized, Serum: 4 mg/dL — ABNORMAL LOW (ref 4.5–5.6)

## 2016-10-18 LAB — PHOSPHORUS
PHOSPHORUS: 2.4 mg/dL — AB (ref 2.5–4.6)
PHOSPHORUS: 1.8 mg/dL — AB (ref 2.5–4.6)
Phosphorus: 1.6 mg/dL — ABNORMAL LOW (ref 2.5–4.6)

## 2016-10-18 LAB — PROCALCITONIN: PROCALCITONIN: 2.77 ng/mL

## 2016-10-18 MED ORDER — DIGOXIN 125 MCG PO TABS
0.1250 mg | ORAL_TABLET | Freq: Every day | ORAL | Status: DC
  Administered 2016-10-19 – 2016-10-20 (×2): 0.125 mg
  Filled 2016-10-18 (×2): qty 1

## 2016-10-18 MED ORDER — DIGOXIN 0.25 MG/ML IJ SOLN
0.2500 mg | Freq: Once | INTRAMUSCULAR | Status: AC
  Administered 2016-10-18: 0.25 mg via INTRAVENOUS
  Filled 2016-10-18: qty 2

## 2016-10-18 MED ORDER — FOLIC ACID 1 MG PO TABS
1.0000 mg | ORAL_TABLET | Freq: Every day | ORAL | Status: DC
  Administered 2016-10-18 – 2016-10-19 (×2): 1 mg
  Filled 2016-10-18 (×2): qty 1

## 2016-10-18 MED ORDER — MAGNESIUM SULFATE 2 GM/50ML IV SOLN
2.0000 g | Freq: Once | INTRAVENOUS | Status: AC
  Administered 2016-10-18: 2 g via INTRAVENOUS
  Filled 2016-10-18: qty 50

## 2016-10-18 MED ORDER — PANTOPRAZOLE SODIUM 40 MG PO PACK
40.0000 mg | PACK | Freq: Every day | ORAL | Status: DC
  Administered 2016-10-18 – 2016-10-19 (×2): 40 mg
  Filled 2016-10-18 (×2): qty 20

## 2016-10-18 MED ORDER — VITAMIN B-1 100 MG PO TABS
100.0000 mg | ORAL_TABLET | Freq: Every day | ORAL | Status: DC
  Administered 2016-10-19: 100 mg
  Filled 2016-10-18: qty 1

## 2016-10-18 MED ORDER — SODIUM CHLORIDE 0.9 % IV SOLN
1.0000 g | Freq: Once | INTRAVENOUS | Status: AC
  Administered 2016-10-18: 1 g via INTRAVENOUS
  Filled 2016-10-18: qty 10

## 2016-10-18 MED ORDER — SODIUM PHOSPHATES 45 MMOLE/15ML IV SOLN
30.0000 mmol | Freq: Once | INTRAVENOUS | Status: AC
  Administered 2016-10-18: 30 mmol via INTRAVENOUS
  Filled 2016-10-18: qty 10

## 2016-10-18 MED ORDER — POTASSIUM PHOSPHATES 15 MMOLE/5ML IV SOLN
30.0000 mmol | Freq: Once | INTRAVENOUS | Status: AC
  Administered 2016-10-18: 30 mmol via INTRAVENOUS
  Filled 2016-10-18: qty 10

## 2016-10-18 MED ORDER — POTASSIUM CHLORIDE 10 MEQ/100ML IV SOLN
10.0000 meq | INTRAVENOUS | Status: AC
  Administered 2016-10-18 (×4): 10 meq via INTRAVENOUS
  Filled 2016-10-18 (×4): qty 100

## 2016-10-18 MED ORDER — POTASSIUM CHLORIDE 10 MEQ/50ML IV SOLN
10.0000 meq | INTRAVENOUS | Status: AC
  Administered 2016-10-18 (×2): 10 meq via INTRAVENOUS
  Filled 2016-10-18 (×2): qty 50

## 2016-10-18 MED ORDER — FUROSEMIDE 10 MG/ML IJ SOLN
10.0000 mg/h | INTRAVENOUS | Status: DC
  Administered 2016-10-18 – 2016-10-19 (×2): 10 mg/h via INTRAVENOUS
  Filled 2016-10-18 (×4): qty 25

## 2016-10-18 NOTE — Progress Notes (Signed)
PULMONARY / CRITICAL CARE MEDICINE   Name: Gabriel Lopez MRN: [id number] DOB: [birth year]    ADMISSION DATE:  10/16/2016 CONSULTATION DATE:  10/16/2016  REFERRING MD:  Dr. Tresa Endo  CHIEF COMPLAINT:  Cardiac Arrest  HISTORY OF PRESENT ILLNESS:   Information obtained from thorough medical review since the patient is currently intubated and sedated.    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  He presented to the ED this am with complaints of progressively worse SSCP since 0400 with associated diaphoresis, nausea, and vomiting.  Per the family, he has not been feeling well for several days per his family.  In the ED, EKG showed an anterior STEMI.   He went into VF to ?Torsades arrest, treated with CPR, epi, defibrillated 5 times, and amiodarone for 10 mins prior to ROSC,  Afterwards, he became uncooperative and verbally belligerent and therefore intubated for airway protection and taken to the Cath lab.    In the cath lab, he was stented x 2 for LAD occulusion, placed on IABP and vasopressors for cardiogenic shock.  He was intermittently agitated during procedure.  PCCM consulted.     SUBJECTIVE:  Hypotension overnight after lasix gtt started, required increased in levophed.  Lasix lowered to 4mg /hr. BP this AM 101/55 on levophed.   VITAL SIGNS: BP 115/64   Pulse 82   Temp 99.2 F (37.3 C)   Resp (!) 35   Ht 5\' 5"  (1.651 m)   Wt 73 kg (160 lb 15 oz)   SpO2 98%   BMI 26.78 kg/m   HEMODYNAMICS: CVP:  [8 mmHg-15 mmHg] 8 mmHg  VENTILATOR SETTINGS: Vent Mode: PRVC FiO2 (%):  [40 %-50 %] 40 % Set Rate:  [35 bmp] 35 bmp Vt Set:  [490 mL-530 mL] 530 mL PEEP:  [5 cmH20-8 cmH20] 5 cmH20 Plateau Pressure:  [20 cmH20-28 cmH20] 22 cmH20  INTAKE / OUTPUT: I/O last 3 completed shifts: In: 5969.7 [I.V.:3411.8; NG/GT:1297.9; IV Piggyback:1260] Out: 3075 [Urine:3075]  PHYSICAL  EXAMINATION: General:  Older than stated age, awake but comfortable HEENT: MM pink/moist, ETT, multiple facial piercings  Neuro: Awake despite sedation but remains comfortable, follows commands CV: s1s2 rrr, mechanical sounds from IABP PULM: even/non-labored on MV, lungs bilaterally clear, diminished in the bases BD:HDIX, non-tender, mechanical sounds, bilateral inguinal hernia Extremities: warm/dry, no edema  Skin: no rashes or lesions, multiple tattoos    LABS:  BMET  Recent Labs Lab 10/17/16 1731 10/17/16 2324 10/18/16 0416  NA 135 133* 132*  K 3.2* 3.2* 3.3*  CL 104 102 101  CO2 22 20* 20*  BUN 15 16 14   CREATININE 1.18 1.10 1.05  GLUCOSE 133* 173* 158*    Electrolytes  Recent Labs Lab 10/17/16 1208 10/17/16 1731 10/17/16 2324 10/18/16 0416  CALCIUM 7.8* 8.0* 7.4* 7.2*  MG 1.7  --  1.8 2.4  PHOS 2.9  --  1.6* 2.4*    CBC  Recent Labs Lab 10/16/16 0652 10/16/16 0700 10/17/16 0430 10/18/16 0416  WBC 21.0*  --  26.5* 20.3*  HGB 15.2 16.7 13.0 11.4*  HCT 46.1 49.0 40.7 34.1*  PLT 377  --  299 234    Coag's  Recent Labs Lab 10/16/16 0652 10/16/16 1458  APTT 30  --   INR 0.96 1.59    Sepsis Markers  Recent Labs Lab 10/16/16 1133 10/16/16 1436 10/16/16 2057 10/17/16 0430 10/18/16 0416  LATICACIDVEN  --  2.6* 2.5* 2.1*  --   PROCALCITON 0.24  --   --  3.56 2.77    ABG  Recent Labs Lab 10/17/16 0007 10/17/16 0350 10/17/16 1000  PHART 7.392 7.296* 7.260*  PCO2ART 28.0* 42.7 48.1*  PO2ART 70.0* 134.0* 131*    Liver Enzymes  Recent Labs Lab 10/16/16 0652  AST 37  ALT 23  ALKPHOS 71  BILITOT 0.5  ALBUMIN 3.9    Cardiac Enzymes  Recent Labs Lab 10/16/16 0652 10/16/16 0949 10/16/16 1133  TROPONINI 0.77* 11.39* 25.67*    Glucose  Recent Labs Lab 10/17/16 0818 10/17/16 1209 10/17/16 1609 10/17/16 1920 10/17/16 2331 10/18/16 0346  GLUCAP 105* 100* 136* 142* 171* 164*    Imaging No results  found.   STUDIES:  EKG 5/1 > anterior STEMI Cardiac Cath 5/1 > RCA 80% stenosed, mid RCA lesion one 95% stenosed, mid RCA lesion two 100% stenosed, prox LAD to mid LAD lesion 100% sten2osed > DES placed, IABP placed. PCXR 5/1 > CHF. Echo 5/2 >  EF 20%, moderate LVH, mid MR. PAP 34.  CULTURES: BC x 2 5/1 > [place] 5/1 >  ANTIBIOTICS:  SIGNIFICANT EVENTS: 5/1 > Admit STEMI/ Cardiac arrest/ cath lab stent x 2/ IABP  LINES/TUBES: ETT 5/1 R sheath 5/1 L IJ CVL 5/1 >   DISCUSSION: 35 yoM admitted with anterior STEMI, progressed to VF/ ? torsedes arrest,  epi, shocked 5 times, amio/lido, CPR x 10 mins before ROSC.  Intubated afterwards airway protection and combativeness.  Taken to the Cath lab, stented x 2 to LAD and IABP placed.    ASSESSMENT / PLAN:  PULMONARY A: Acute Respiratory Failure - in the setting of STEMI/ Cardiac Arrest Respiratory acidosis - vent settings adjusted 5/1 though remained slightly acidemic Acute pulmonary edema P:   Assess ABG VAP protocol Hold SBT for now given continued IABP support, pressors, etc Daily CXR Continue lasix gtt, goal neg balanace  CARDIOVASCULAR A:  Anterior STEMI - s/p 2 stents to LAD.  VF Cardiac Arrest - ? Torsades - down 10 min prior to ROSC.  Started on normothermia which was d/c'd 5/2 as pt was awake and following commands. Cardiogenic Shock - IABP 5/1.  Co-ox 61. Newly diagnosed severe sCHF - EF 20%. P:  Continue IABP per Cards Continue levophed, goal MAP > 65 (change levo to quad strength) ? Whether needs additional IV inotropic support vs adjusting IABP ratio - will defer to cards Would get advanced heart failure team on board Continue Amio gtt, ASA, brilinta per Cards  RENAL A:   Hyponatremia - volume overload. Hypokalemia - s/p repletion. Hypocalcemia. Hypophosphatemia - s/p repletion. P:   Goal Mg > 2, K > 4 1g Ca gluconate Continue lasix gtt BMP q12 hrs  GASTROINTESTINAL A:   GI prophylaxis P:   SUP:  Pantoprazole  NPO Start TF's  HEMATOLOGIC A:   VTE prophylaxis P:   SCD's / Heparin CBC daily  INFECTIOUS A:   Leukocytosis- initially thought to be reactive; however slight rise this AM and PCT 3.6. P:   CTX on board, pct trend down, consider 5 days  Follow cultures. Dc vanc  ENDOCRINE A:   No acute issues  P:   SSI if glucose consistently > 180  NEUROLOGIC A:   Acute encephalopathy- in the setting of cardiac arrest  P:   RASS goal: 0 to -1 Fentanyl gtt / versed for PAD protocol Daily WUA Continue thiamine / folic acid  FAMILY  - Updates: Son  in law updated at bedside.  - Inter-disciplinary family meet or Palliative Care meeting due by:  Oct 23, 2016  CC time: 30 mins    Rutherford Guys, [place] [facility] Pager: [phone]  or [phone] 10/18/2016, 7:41 AM    STAFF NOTE: I, Rory Percy, MD FACP have personally reviewed patient's available data, including medical history, events of note, physical examination and test results as part of my evaluation. I have discussed with resident/NP and other care providers such as pharmacist, RN and RRT. In addition, I personally evaluated patient and elicited key findings of: awakens, fc, lungs clear anterior, crackles bases, a lot of tatoos, int wheeze reported by RT, pcxr improved overall, levophed is improved, we need to re add lasix infusion, goal neg 1 liter , ABG reviewed, reduce rate ? MV, NO SBT until balloon pump off, svo2 noted, consider inotropic support? Per cards, will repeat pcxr in am, full WUA on going with sedation, chem in am , k supp , PCT down and no clear source ID, will shorten ABX to ctx 5 days totoal, dc vanc, bmet , mg, phos in PM with lasix restart, family updated by me in room The patient is critically ill with multiple organ systems failure and requires high complexity decision making for assessment and support, frequent evaluation and titration of therapies,  application of advanced monitoring technologies and extensive interpretation of multiple databases.   Critical Care Time devoted to patient care services described in this note is 30 Minutes. This time reflects time of care of this signee: Rory Percy, MD FACP. This critical care time does not reflect procedure time, or teaching time or supervisory time of PA/NP/Med student/Med Resident etc but could involve care discussion time. Rest per NP/medical resident whose note is outlined above and that I agree with   Mcarthur Rossetti. Tyson Alias, MD, FACP Pgr: [phone] [facility] 10/18/2016 9:34 AM

## 2016-10-18 NOTE — Progress Notes (Signed)
ANTICOAGULATION CONSULT NOTE   Pharmacy Consult for Heparin Indication: IABP  No Known Allergies  Patient Measurements: Height: _0  (165.1 cm) Weight: 160 lb 15 oz (73 kg) IBW/kg (Calculated) : 61.5 Heparin Dosing Weight: 71.5kg  Vital Signs: Temp: 97.8 F (36.6 C) (05/03 1259) Temp Source: Axillary (05/03 1259) BP: 128/73 (05/03 1200) Pulse Rate: 91 (05/03 1200)  Labs:  Recent Labs  10/16/16 0652 10/16/16 0700 10/16/16 0949 10/16/16 1133  10/16/16 1458  10/17/16 0430  10/17/16 1731 10/17/16 2324 10/18/16 0416 10/18/16 0420  HGB 15.2 16.7  --   --   --   --   --  13.0  --   --   --  11.4*  --   HCT 46.1 49.0  --   --   --   --   --  40.7  --   --   --  34.1*  --   PLT 377  --   --   --   --   --   --  299  --   --   --  234  --   APTT 30  --   --   --   --   --   --   --   --   --   --   --   --   LABPROT 12.8  --   --   --   --  19.1*  --   --   --   --   --   --   --   INR 0.96  --   --   --   --  1.59  --   --   --   --   --   --   --   HEPARINUNFRC  --   --   --   --   --   --   < > 0.49  < > 0.37 0.31  --  0.31  CREATININE 1.06 1.00  --   --   < >  --   < > 1.22  < > 1.18 1.10 1.05  --   TROPONINI 0.77*  --  11.39* 25.67*  --   --   --   --   --   --   --   --   --   < > = values in this interval not displayed.  Estimated Creatinine Clearance: 60.2 mL/min (by C-G formula based on SCr of 1.05 mg/dL).   Medical History: No past medical history on file.  Medications:  Prescriptions Prior to Admission  Medication Sig Dispense Refill Last Dose  . aspirin EC 81 MG tablet Take 81 mg by mouth daily.   10/16/2016 at Unknown  . ranitidine (ZANTAC) 75 MG tablet Take 150 mg by mouth daily as needed for heartburn.   Unknown at Unknown   Scheduled:  . aspirin  81 mg Oral Daily  . atorvastatin  80 mg Oral q1800  . chlorhexidine gluconate (MEDLINE KIT)  15 mL Mouth Rinse BID  . Chlorhexidine Gluconate Cloth  6 each Topical Daily  . [START ON 10/19/2016] digoxin  0.125  mg Per Tube Daily  . fentaNYL (SUBLIMAZE) injection  50 mcg Intravenous Once  . folic acid  1 mg Per Tube Daily  . insulin aspart  0-9 Units Subcutaneous Q4H  . mouth rinse  15 mL Mouth Rinse 10 times per day  . pantoprazole sodium  40 mg Per Tube Daily  . sodium  chloride flush  10-40 mL Intracatheter Q12H  . sodium chloride flush  3 mL Intravenous Q12H  . thiamine  100 mg Per Tube Daily  . ticagrelor  90 mg Oral BID   Infusions:  . sodium chloride 10 mL/hr at 10/18/16 0730  . sodium chloride    . amiodarone    . amiodarone 30 mg/hr (10/18/16 1037)  . cefTRIAXone (ROCEPHIN)  IV Stopped (10/17/16 1335)  . feeding supplement (VITAL AF 1.2 CAL) 1,000 mL (10/18/16 0955)  . fentaNYL infusion INTRAVENOUS 100 mcg/hr (10/18/16 1000)  . furosemide (LASIX) infusion 10 mg/hr (10/18/16 0944)  . heparin 900 Units/hr (10/18/16 0730)  . norepinephrine (LEVOPHED) Adult infusion 8 mcg/min (10/18/16 1037)    Assessment: Patient is a 66 year old male admitted 10/16/16 as code STEMI s/p VT and VF w/ CPR in the ED on arrival. In cath, he was stented x2 to LAD and had IABP placed.  Heparin to continue while IABP in use.  Confirmatory heparin level therapeutic at 0.31 units/mL on 900 units/hr. Hemoglobin is a little low and platelet count within normal limits. No bleeding complications noted.   Goal of Therapy:  Heparin level 0.2-0.5 units/ml Monitor platelets by anticoagulation protocol: Yes   Plan:  Continue heparin 900 units/hr IV. Daily heparin level and CBC Monitor signs/symptoms of bleeding  Gabriel Lopez, PharmD Acute Care Pharmacy Resident  Pager: [phone] 10/18/2016

## 2016-10-18 NOTE — Progress Notes (Signed)
eLink Physician-Brief Progress Note Patient Name: Gabriel Lopez DOB: October 15, 1950 MRN: [id number]   Date of Service  10/18/2016  HPI/Events of Note  Low K, Phos,Mg  eICU Interventions  Replaced lytes     Intervention Category Major Interventions: Electrolyte abnormality - evaluation and management  Tamya Denardo 10/18/2016, 12:14 AM

## 2016-10-18 NOTE — Progress Notes (Signed)
Gabriel Lopez  Candida Peeling, RN        PATIENT HAS M'CARE: I HAVE CHECK WITH [email or web address]  AND NO RX COVERAGE LISTED.   Previous Messages    ----- Message -----  From: Candida Peeling, RN  Sent: 10/17/2016 10:41 AM  To: [facility] Case Mgr Assistant  Subject: benefits check                  Please check coverage for Brilinta 90 mg bid- thank you

## 2016-10-18 NOTE — Progress Notes (Signed)
[facility] ADULT ICU REPLACEMENT PROTOCOL FOR AM LAB REPLACEMENT ONLY  The patient does apply for the [facility] Adult ICU Electrolyte Replacment Protocol based on the criteria listed below:   1. Is GFR >/= 40 ml/min? Yes.    Patient's GFR today is >60 2. Is urine output >/= 0.5 ml/kg/hr for the last 6 hours? Yes.   Patient's UOP is 1.2 ml/kg/hr 3. Is BUN < 60 mg/dL? Yes.    Patient's BUN today is 14 4. Abnormal electrolyte(s): k 3.3 5. Ordered repletion with: protocol 6. If a panic level lab has been reported, has the CCM MD in charge been notified? No..   Physician:    Markus Daft A 10/18/2016 5:39 AM

## 2016-10-18 NOTE — Progress Notes (Signed)
ANTICOAGULATION CONSULT NOTE   Pharmacy Consult for Heparin Indication: IABP  No Known Allergies  Patient Measurements: Height: 5\' 5"  (165.1 cm) Weight: 157 lb 3 oz (71.3 kg) (per bed history) IBW/kg (Calculated) : 61.5 Heparin Dosing Weight: 71.5kg  Vital Signs: Temp: 98.1 F (36.7 C) (05/02 2346) Temp Source: Oral (05/02 2346) BP: 105/60 (05/03 0000) Pulse Rate: 85 (05/03 0000)  Labs:  Recent Labs  10/16/16 0652 10/16/16 0700 10/16/16 0949 10/16/16 1133  10/16/16 1458  10/17/16 0430 10/17/16 1207 10/17/16 1208 10/17/16 1731 10/17/16 2324  HGB 15.2 16.7  --   --   --   --   --  13.0  --   --   --   --   HCT 46.1 49.0  --   --   --   --   --  40.7  --   --   --   --   PLT 377  --   --   --   --   --   --  299  --   --   --   --   APTT 30  --   --   --   --   --   --   --   --   --   --   --   LABPROT 12.8  --   --   --   --  19.1*  --   --   --   --   --   --   INR 0.96  --   --   --   --  1.59  --   --   --   --   --   --   HEPARINUNFRC  --   --   --   --   --   --   < > 0.49 0.33  --  0.37 0.31  CREATININE 1.06 1.00  --   --   < >  --   < > 1.22  --  1.13 1.18 1.10  TROPONINI 0.77*  --  11.39* 25.67*  --   --   --   --   --   --   --   --   < > = values in this interval not displayed.  Estimated Creatinine Clearance: 57.5 mL/min (by C-G formula based on SCr of 1.1 mg/dL).  Assessment: Patient is a 66 year old male admitted 10/16/16 as code STEMI s/p VT and VF w/ CPR in the ED on arrival. In cath, he was stented x2 to LAD and had IABP placed.  Heparin to continue while IABP in use.  Heparin level remains therapeutic at 0.31 units/mL on 900 units/hr. Pt fully rewarmed.  Goal of Therapy:  Heparin level 0.2-0.5 units/ml Monitor platelets by anticoagulation protocol: Yes   Plan:  Continue heparin 900 units/hr IV. Daily heparin level and CBC  Christoper Fabian, PharmD, BCPS Clinical pharmacist, pager [phone]  10/18/2016 12:30 AM

## 2016-10-18 NOTE — Consult Note (Signed)
Advanced Heart Failure Team Consult Note   Primary Physician: Primary Cardiologist:    Reason for Consultation: Cardiogenic Shock   HPI:    Gabriel Lopez is seen today for evaluation of cardiogenic shock  at the request of Dr Angelena Form. PMH/ROS obtained from the chart as he is intubated.   Gabriel Lopez is a 66 year old with no known PMH. He presented to the ED with increased SOB, N/V, and chest pain. In the ED he went to [facility] and received CPR, defibrillated x5, intubated, epi, and amio. Once stabilized he was taken to the cath lab and had stent to LAD and IABP was placed. On admit WBC 21 and he was placed on vanc and rocephin for suspected aspiration.    Post admitted to ICU intubated + IABP. He remains intubated. Remains on norepi 10 mcg, lasix drip at 10 mg per hour, fentanyl drip, heparin drip, and amio at 30 mg per hour. Earlier today norepi was weaned from 27 to 10 mcg. WBC today 20. Blood CX negative so Vanc stopped but he remains on rocephin for possible aspiration.  CXR with persistent edema so lasix drip was increased from 5 mg to 10 mg per hour.   ECHO 10/17/2016 Left ventricle: Akinesis of septum and apex no thrombus. Severe   hypokinesis of inferior and anterior walls only basal function   preserved. The cavity size was severely dilated. Wall thickness   was increased in a pattern of moderate LVH. The estimated   ejection fraction was 20%. Left ventricular diastolic function   parameters were normal. - Mitral valve: There was mild regurgitation. - Left atrium: The atrium was moderately dilated. - Atrial septum: No defect or patent foramen ovale was identified. - Pulmonary arteries: PA peak pressure: 34 mm Hg (S).  LHC 10/16/2016   Ost RCA to Prox RCA lesion, 80 %stenosed.  Mid RCA-1 lesion, 95 %stenosed.  Mid RCA-2 lesion, 100 %stenosed.  A STENT SYNERGY DES 2.5X16 drug eluting stent was successfully placed.  Prox LAD to Mid LAD lesion, 100 %stenosed.  Post  intervention, there is a 0% residual stenosis.  Review of Systems: [y] = yes, [ ]  = no As above obtained from medical record as he is intubated.   General: Weight gain [ ] ; Weight loss [ ] ; Anorexia [ ] ; Fatigue [ ] ; Fever [ ] ; Chills [ ] ; Weakness [ ]   Cardiac: Chest pain/pressure [Y ]; Resting SOB [Y ]; Exertional SOB [Y ]; Orthopnea [ ] ; Pedal Edema [ ] ; Palpitations [ ] ; Syncope [ ] ; Presyncope [ ] ; Paroxysmal nocturnal dyspnea[ ]   Pulmonary: Cough [ ] ; Wheezing[ ] ; Hemoptysis[ ] ; Sputum [ ] ; Snoring [ ]   GI: Vomiting[ ] ; Dysphagia[ ] ; Melena[ ] ; Hematochezia [ ] ; Heartburn[ ] ; Abdominal pain [ ] ; Constipation [ ] ; Diarrhea [ ] ; BRBPR [ ]   GU: Hematuria[ ] ; Dysuria [ ] ; Nocturia[ ]   Vascular: Pain in legs with walking [ ] ; Pain in feet with lying flat [ ] ; Non-healing sores [ ] ; Stroke [ ] ; TIA [ ] ; Slurred speech [ ] ;  Neuro: Headaches[ ] ; Vertigo[ ] ; Seizures[ ] ; Paresthesias[ ] ;Blurred vision [ ] ; Diplopia [ ] ; Vision changes [ ]   Ortho/Skin: Arthritis [ ] ; Joint pain [ ] ; Muscle pain [ ] ; Joint swelling [ ] ; Back Pain [ ] ; Rash [ ]   Psych: Depression[ ] ; Anxiety[ ]   Heme: Bleeding problems [ ] ; Clotting disorders [ ] ; Anemia [ ]   Endocrine: Diabetes [ ] ; Thyroid dysfunction[ ]   Home Medications  Prior to Admission medications   Medication Sig Start Date End Date Taking? Authorizing Provider  aspirin EC 81 MG tablet Take 81 mg by mouth daily.   Yes Historical Provider, MD  ranitidine (ZANTAC) 75 MG tablet Take 150 mg by mouth daily as needed for heartburn.   Yes Historical Provider, MD    Past Medical History: No past medical history on file.  Past Surgical History: Past Surgical History:  Procedure Laterality Date  . ABDOMINAL AORTOGRAM N/A 10/16/2016   Procedure: Abdominal Aortogram;  Surgeon: Troy Sine, MD;  Location: [facility];  Service: Cardiovascular;  Laterality: N/A;  . CARDIOVERSION N/A 10/16/2016   Procedure: Cardioversion;  Surgeon: Troy Sine, MD;   Location: [facility];  Service: Cardiovascular;  Laterality: N/A;  . IABP INSERTION N/A 10/16/2016   Procedure: IABP Insertion;  Surgeon: Troy Sine, MD;  Location: [facility];  Service: Cardiovascular;  Laterality: N/A;  . LEFT HEART CATH AND CORONARY ANGIOGRAPHY N/A 10/16/2016   Procedure: Left Heart Cath and Coronary Angiography;  Surgeon: Troy Sine, MD;  Location: [facility];  Service: Cardiovascular;  Laterality: N/A;    Family History: No family history on file.  Social History: Social History   Social History  . Marital status: Single    Spouse name: N/A  . Number of children: N/A  . Years of education: N/A   Social History Main Topics  . Smoking status: Current Every Day Smoker    Types: Cigarettes  . Smokeless tobacco: Never Used  . Alcohol use No  . Drug use: No  . Sexual activity: Not Asked   Other Topics Concern  . None   Social History Narrative  . None    Allergies:  No Known Allergies  Objective:    Vital Signs:   Temp:  [89.1 F (31.7 C)-102.1 F (38.9 C)] 99.2 F (37.3 C) (05/03 0730) Pulse Rate:  [80-181] 93 (05/03 1000) Resp:  [11-35] 20 (05/03 1000) BP: (88-135)/(48-77) 135/72 (05/03 1000) SpO2:  [90 %-100 %] 99 % (05/03 1000) Arterial Line BP: (80-128)/(44-70) 120/62 (05/03 1000) FiO2 (%):  [40 %] 40 % (05/03 0829) Weight:  [160 lb 15 oz (73 kg)] 160 lb 15 oz (73 kg) (05/03 0433) Last BM Date:  (PTA)  Weight change: Filed Weights   10/16/16 0930 10/17/16 0500 10/18/16 0433  Weight: 157 lb 10.1 oz (71.5 kg) 157 lb 3 oz (71.3 kg) 160 lb 15 oz (73 kg)    Intake/Output:   Intake/Output Summary (Last 24 hours) at 10/18/16 1011 Last data filed at 10/18/16 1000  Gross per 24 hour  Intake          4762.59 ml  Output             2655 ml  Net          2107.59 ml     Physical Exam: CVP 13-14  General:  Intubated.  HEENT: normal Neck: supple. LIJ . JVP to jaw . Carotids 2+ bilat; no bruits. No  lymphadenopathy or thyromegaly appreciated. Cor: PMI nondisplaced. Regular rate & rhythm. No rubs, or murmurs. +S3  Lungs: Rhonchi throughout. Abdomen: soft, nontender, nondistended. No hepatosplenomegaly. No bruits or masses. Good bowel sounds. Extremities: no cyanosis, clubbing, rash, R and LLE no edema. LLE immobilizer in place.  Feet cool. Able to doppler pedal pulses. R going IABP.  Neuro: Intubated. Follows commands and MAE.   Telemetry: NSR with LBBB  Labs: Basic  Metabolic Panel:  Recent Labs Lab 10/16/16 0949  10/17/16 0430 10/17/16 1208 10/17/16 1731 10/17/16 2324 10/18/16 0416  NA  --   < > 136 135 135 133* 132*  K  --   < > 4.5 4.1 3.2* 3.2* 3.3*  CL  --   < > 105 106 104 102 101  CO2  --   < > 23 18* 22 20* 20*  GLUCOSE  --   < > 122* 105* 133* 173* 158*  BUN  --   < > 9 13 15 16 14   CREATININE  --   < > 1.22 1.13 1.18 1.10 1.05  CALCIUM  --   < > 7.9* 7.8* 8.0* 7.4* 7.2*  MG 2.3  --   --  1.7  --  1.8 2.4  PHOS  --   --   --  2.9  --  1.6* 2.4*  < > = values in this interval not displayed.  Liver Function Tests:  Recent Labs Lab 10/16/16 0652  AST 37  ALT 23  ALKPHOS 71  BILITOT 0.5  PROT 7.3  ALBUMIN 3.9   No results for input(s): LIPASE, AMYLASE in the last 168 hours. No results for input(s): AMMONIA in the last 168 hours.  CBC:  Recent Labs Lab 10/16/16 0652 10/16/16 0700 10/17/16 0430 10/18/16 0416  WBC 21.0*  --  26.5* 20.3*  NEUTROABS 12.1*  --   --   --   HGB 15.2 16.7 13.0 11.4*  HCT 46.1 49.0 40.7 34.1*  MCV 78.5  --  79.0 74.9*  PLT 377  --  299 234    Cardiac Enzymes:  Recent Labs Lab 10/16/16 0652 10/16/16 0949 10/16/16 1133  TROPONINI 0.77* 11.39* 25.67*    BNP: BNP (last 3 results) No results for input(s): BNP in the last 8760 hours.  ProBNP (last 3 results) No results for input(s): PROBNP in the last 8760 hours.   CBG:  Recent Labs Lab 10/17/16 1209 10/17/16 1609 10/17/16 1920 10/17/16 2331  10/18/16 0346  GLUCAP 100* 136* 142* 171* 164*    Coagulation Studies:  Recent Labs  10/16/16 0652 10/16/16 1458  LABPROT 12.8 19.1*  INR 0.96 1.59    Other results: EKG: Admission NSR with anterior ST elevation  Imaging: Dg Chest Port 1 View  Result Date: 10/18/2016 CLINICAL DATA:  Post cardiac surgery. Subsequent encounter. Intubated patient. EXAM: PORTABLE CHEST 1 VIEW COMPARISON:  10/17/2016 FINDINGS: Mild interstitial and hazy airspace lung opacities predominating centrally are stable consistent with mild pulmonary edema. No new lung abnormalities. No pneumothorax. Endotracheal tube, left internal jugular central venous line, nasogastric tube and aortic balloon pump are stable and well positioned. IMPRESSION: 1. No change from the previous day's study. 2. Mild persistent pulmonary edema. 3. Support apparatus is stable and well positioned. Electronically Signed   By: Lajean Manes M.D.   On: 10/18/2016 07:43      Medications:     Current Medications: . aspirin  81 mg Oral Daily  . atorvastatin  80 mg Oral q1800  . chlorhexidine gluconate (MEDLINE KIT)  15 mL Mouth Rinse BID  . Chlorhexidine Gluconate Cloth  6 each Topical Daily  . fentaNYL (SUBLIMAZE) injection  50 mcg Intravenous Once  . folic acid  1 mg Per Tube Daily  . insulin aspart  0-9 Units Subcutaneous Q4H  . mouth rinse  15 mL Mouth Rinse 10 times per day  . pantoprazole sodium  40 mg Per Tube Daily  . sodium  chloride flush  10-40 mL Intracatheter Q12H  . sodium chloride flush  3 mL Intravenous Q12H  . thiamine  100 mg Per Tube Daily  . ticagrelor  90 mg Oral BID     Infusions: . sodium chloride 10 mL/hr at 10/18/16 0730  . sodium chloride    . amiodarone    . amiodarone 30 mg/hr (10/18/16 0730)  . cefTRIAXone (ROCEPHIN)  IV Stopped (10/17/16 1335)  . feeding supplement (VITAL AF 1.2 CAL) 1,000 mL (10/18/16 0955)  . fentaNYL infusion INTRAVENOUS 100 mcg/hr (10/18/16 1000)  . furosemide (LASIX)  infusion 10 mg/hr (10/18/16 0944)  . heparin 900 Units/hr (10/18/16 0730)  . norepinephrine (LEVOPHED) Adult infusion 10 mcg/min (10/18/16 0840)      Assessment/Plan  Gabriel Lopez is 66 year old with no known PMH admitted with STEMI complicated by VT arrest and cardiogenic shock.   1. Cardiogenic Shock in the setting of STEMI ECHO 10/17/2016 EF 20% RV normal.  IABP 1:1placed. Hgb/Platelets ok. MAPs improving. Weaning norepi today.  CO-OX today 67%. Will not add milrinone with adequate CO-OX.  CVP 13-14. Volume status elevated. Continue lasix drip.  Add 0.25 mg digoxin today then digoxin 0.125 mg tomorrow.  Renal function stable.  Holding off on ARB/spiro  2. VT arrest- CPR with Defibrillation x5. Continue amio  drip 30 mg per hour.  K 3.3. K supplements. Mag 2.4  3. Acute Respiratory Failure- intubated 5/1. Per CCM. Hopefully can wean as volume improves.  4. STEMI- anterior MI--> LHC 5/1 DES LAD. Chronically occluded RCA Continue asa, statin, and ticagrelor.  5.  Suspected Aspiration Pneumonia- WBC elevated post arrest. Started on vanc + rocephin Blood CX NGTD . Vanc stopped today.  6. RBBB   Length of Stay: 2  Darrick Grinder, NP  10/18/2016, 10:11 AM  Advanced Heart Failure Team Pager [phone] (M-F; 7a - 4p)  Please contact [facility] for night-coverage after hours (4p -7a ) and weekends on [email or web address]  Patient seen with NP, agree with the above note.  1. Cardiogenic shock: Echo with EF 20% with normal RV.  He is on IABP at 1:1 and norepinephrine, weaning down. CVP 13-14, co-ox 67%.  Awake on vent.  - Wean norepinephrine down today, hopefully off.  Add digoxin.  - Hopefully will be able to wean off IABP tomorrow morning.  - Lasix gtt at 10 mg/hr to continue, good UOP so far.  2. CAD: S/p anterior STEMI, DES placed.  Patient also has a chronically occluded RCA, symptoms suggest possible MI a year or more ago.   - Continue ASA, ticagrelor, statin.  3. Acute hypoxemic respiratory  failure: He is intubated with pulmonary edema on CXR and possible aspiration PNA.  WBCs elevated, PCT elevated.  Cultures negative so far.  - Continue vancomycin/Zosyn.   4. VT arrest: Peri-MI. Currently on amiodarone gtt, still with frequent ectopy.  Continue today, will wean off hopefully once he is off norepinephrine.  Suspect he will need Lifevest at discharge.   Loralie Champagne 10/18/2016 11:19 AM

## 2016-10-18 NOTE — Progress Notes (Signed)
eLink Physician-Brief Progress Note Patient Name: Gabriel Lopez DOB: [birth year] MRN: [id number]   Date of Service  10/18/2016  HPI/Events of Note  K+ = 2.18m PO4--- = 1.8 and Creatinine = 1.03.   eICU Interventions  Will replace K+ and PO4---.     Intervention Category Major Interventions: Electrolyte abnormality - evaluation and management  Elza Sortor Eugene 10/18/2016, 6:52 PM

## 2016-10-19 ENCOUNTER — Inpatient Hospital Stay (HOSPITAL_COMMUNITY): Payer: Medicare Other

## 2016-10-19 LAB — POCT ACTIVATED CLOTTING TIME
ACTIVATED CLOTTING TIME: 158 s
Activated Clotting Time: 153 seconds

## 2016-10-19 LAB — BLOOD GAS, ARTERIAL
Acid-Base Excess: 5.4 mmol/L — ABNORMAL HIGH (ref 0.0–2.0)
Bicarbonate: 28.5 mmol/L — ABNORMAL HIGH (ref 20.0–28.0)
FIO2: 40
MECHVT: 530 mL
O2 SAT: 99.6 %
PEEP: 5 cmH2O
PH ART: 7.507 — AB (ref 7.350–7.450)
PO2 ART: 327 mmHg — AB (ref 83.0–108.0)
Patient temperature: 99.7
RATE: 24 resp/min
pCO2 arterial: 36.5 mmHg (ref 32.0–48.0)

## 2016-10-19 LAB — MAGNESIUM
MAGNESIUM: 2.1 mg/dL (ref 1.7–2.4)
MAGNESIUM: 2.1 mg/dL (ref 1.7–2.4)

## 2016-10-19 LAB — GLUCOSE, CAPILLARY
GLUCOSE-CAPILLARY: 150 mg/dL — AB (ref 65–99)
GLUCOSE-CAPILLARY: 104 mg/dL — AB (ref 65–99)
Glucose-Capillary: 116 mg/dL — ABNORMAL HIGH (ref 65–99)
Glucose-Capillary: 122 mg/dL — ABNORMAL HIGH (ref 65–99)
Glucose-Capillary: 150 mg/dL — ABNORMAL HIGH (ref 65–99)
Glucose-Capillary: 160 mg/dL — ABNORMAL HIGH (ref 65–99)
Glucose-Capillary: 173 mg/dL — ABNORMAL HIGH (ref 65–99)

## 2016-10-19 LAB — CBC
HCT: 34.5 % — ABNORMAL LOW (ref 39.0–52.0)
Hemoglobin: 11.1 g/dL — ABNORMAL LOW (ref 13.0–17.0)
MCH: 24.3 pg — AB (ref 26.0–34.0)
MCHC: 32.2 g/dL (ref 30.0–36.0)
MCV: 75.5 fL — AB (ref 78.0–100.0)
PLATELETS: 184 10*3/uL (ref 150–400)
RBC: 4.57 MIL/uL (ref 4.22–5.81)
RDW: 14.2 % (ref 11.5–15.5)
WBC: 19.7 10*3/uL — ABNORMAL HIGH (ref 4.0–10.5)

## 2016-10-19 LAB — COOXEMETRY PANEL
CARBOXYHEMOGLOBIN: 0.7 % (ref 0.5–1.5)
Carboxyhemoglobin: 0.8 % (ref 0.5–1.5)
Carboxyhemoglobin: 0.8 % (ref 0.5–1.5)
Carboxyhemoglobin: 0.8 % (ref 0.5–1.5)
METHEMOGLOBIN: 1 % (ref 0.0–1.5)
METHEMOGLOBIN: 1 % (ref 0.0–1.5)
METHEMOGLOBIN: 1 % (ref 0.0–1.5)
METHEMOGLOBIN: 1 % (ref 0.0–1.5)
O2 SAT: 68 %
O2 Saturation: 68.2 %
O2 Saturation: 66.8 %
O2 Saturation: 65.9 %
TOTAL HEMOGLOBIN: 11.7 g/dL — AB (ref 12.0–16.0)
TOTAL HEMOGLOBIN: 11.3 g/dL — AB (ref 12.0–16.0)
TOTAL HEMOGLOBIN: 11.3 g/dL — AB (ref 12.0–16.0)
Total hemoglobin: 11.2 g/dL — ABNORMAL LOW (ref 12.0–16.0)

## 2016-10-19 LAB — CULTURE, RESPIRATORY: CULTURE: NORMAL

## 2016-10-19 LAB — BASIC METABOLIC PANEL
Anion gap: 13 (ref 5–15)
Anion gap: 11 (ref 5–15)
BUN: 11 mg/dL (ref 6–20)
BUN: 13 mg/dL (ref 6–20)
CALCIUM: 7.1 mg/dL — AB (ref 8.9–10.3)
CALCIUM: 7.4 mg/dL — AB (ref 8.9–10.3)
CHLORIDE: 94 mmol/L — AB (ref 101–111)
CO2: 26 mmol/L (ref 22–32)
CO2: 27 mmol/L (ref 22–32)
CREATININE: 1 mg/dL (ref 0.61–1.24)
CREATININE: 0.97 mg/dL (ref 0.61–1.24)
Chloride: 95 mmol/L — ABNORMAL LOW (ref 101–111)
Glucose, Bld: 162 mg/dL — ABNORMAL HIGH (ref 65–99)
Glucose, Bld: 118 mg/dL — ABNORMAL HIGH (ref 65–99)
Potassium: 3 mmol/L — ABNORMAL LOW (ref 3.5–5.1)
Potassium: 4.7 mmol/L (ref 3.5–5.1)
SODIUM: 133 mmol/L — AB (ref 135–145)
SODIUM: 133 mmol/L — AB (ref 135–145)

## 2016-10-19 LAB — HEPARIN LEVEL (UNFRACTIONATED)
Heparin Unfractionated: 0.15 IU/mL — ABNORMAL LOW (ref 0.30–0.70)
Heparin Unfractionated: 0.22 IU/mL — ABNORMAL LOW (ref 0.30–0.70)

## 2016-10-19 LAB — PHOSPHORUS
PHOSPHORUS: 2.2 mg/dL — AB (ref 2.5–4.6)
PHOSPHORUS: 2.8 mg/dL (ref 2.5–4.6)

## 2016-10-19 LAB — CULTURE, RESPIRATORY W GRAM STAIN: Special Requests: NORMAL

## 2016-10-19 MED ORDER — ACETAMINOPHEN 160 MG/5ML PO SOLN
650.0000 mg | Freq: Four times a day (QID) | ORAL | Status: DC | PRN
  Administered 2016-10-21: 650 mg
  Filled 2016-10-19: qty 20.3

## 2016-10-19 MED ORDER — POTASSIUM PHOSPHATES 15 MMOLE/5ML IV SOLN
30.0000 mmol | Freq: Once | INTRAVENOUS | Status: AC
  Administered 2016-10-19: 30 mmol via INTRAVENOUS
  Filled 2016-10-19: qty 10

## 2016-10-19 MED ORDER — HEPARIN (PORCINE) IN NACL 100-0.45 UNIT/ML-% IJ SOLN: 1000.0000 [IU]/h | INTRAMUSCULAR | Status: DC

## 2016-10-19 MED ORDER — ATORVASTATIN CALCIUM 80 MG PO TABS
80.0000 mg | ORAL_TABLET | Freq: Every day | ORAL | Status: DC
  Administered 2016-10-19: 80 mg
  Filled 2016-10-19: qty 1

## 2016-10-19 MED ORDER — ASPIRIN 81 MG PO CHEW: 81.0000 mg | CHEWABLE_TABLET | Freq: Every day | ORAL | Status: DC

## 2016-10-19 MED ORDER — HEPARIN SODIUM (PORCINE) 5000 UNIT/ML IJ SOLN
5000.0000 [IU] | Freq: Three times a day (TID) | INTRAMUSCULAR | Status: DC
  Administered 2016-10-20 – 2016-10-23 (×8): 5000 [IU] via SUBCUTANEOUS
  Filled 2016-10-19 (×8): qty 1

## 2016-10-19 MED ORDER — AMIODARONE HCL 200 MG PO TABS
200.0000 mg | ORAL_TABLET | Freq: Two times a day (BID) | ORAL | Status: DC
  Administered 2016-10-19 – 2016-10-20 (×2): 200 mg
  Filled 2016-10-19 (×2): qty 1

## 2016-10-19 MED ORDER — DEXTROSE 5 % IV SOLN
2.0000 g | INTRAVENOUS | Status: AC
  Administered 2016-10-20 – 2016-10-21 (×2): 2 g via INTRAVENOUS
  Filled 2016-10-19 (×2): qty 2

## 2016-10-19 MED ORDER — POTASSIUM CHLORIDE 20 MEQ PO PACK
40.0000 meq | PACK | Freq: Once | ORAL | Status: AC
  Administered 2016-10-19: 40 meq via ORAL
  Filled 2016-10-19: qty 2

## 2016-10-19 MED ORDER — AMIODARONE HCL 200 MG PO TABS
200.0000 mg | ORAL_TABLET | Freq: Two times a day (BID) | ORAL | Status: DC
  Administered 2016-10-19: 200 mg via ORAL
  Filled 2016-10-19: qty 1

## 2016-10-19 MED ORDER — POTASSIUM CHLORIDE 20 MEQ/15ML (10%) PO SOLN
40.0000 meq | ORAL | Status: AC
Start: 2016-10-19 — End: 2016-10-19
  Administered 2016-10-19 (×2): 40 meq
  Filled 2016-10-19 (×2): qty 30

## 2016-10-19 NOTE — Progress Notes (Signed)
ETT pulled back per MD order. Patient tolerated well. No complications. Vital signs stable at this time. RT will continue to monitor.

## 2016-10-19 NOTE — Progress Notes (Signed)
Patient ID: Gabriel Lopez, male   DOB: Nov 15, 1950, 66 y.o.   MRN: [id number]   SUBJECTIVE: He is awake/alert on vent.  Norepinephrine down to 7.  Remains on IABP 1:1.  Co-ox 68%, CVP 9-10.  Diuresed yesterday on Lasix gtt.   Scheduled Meds: . amiodarone  200 mg Oral BID  . aspirin  81 mg Oral Daily  . atorvastatin  80 mg Oral q1800  . chlorhexidine gluconate (MEDLINE KIT)  15 mL Mouth Rinse BID  . Chlorhexidine Gluconate Cloth  6 each Topical Daily  . digoxin  0.125 mg Per Tube Daily  . fentaNYL (SUBLIMAZE) injection  50 mcg Intravenous Once  . folic acid  1 mg Per Tube Daily  . insulin aspart  0-9 Units Subcutaneous Q4H  . mouth rinse  15 mL Mouth Rinse 10 times per day  . pantoprazole sodium  40 mg Per Tube Daily  . potassium chloride  40 mEq Oral Once  . sodium chloride flush  10-40 mL Intracatheter Q12H  . sodium chloride flush  3 mL Intravenous Q12H  . thiamine  100 mg Per Tube Daily  . ticagrelor  90 mg Oral BID   Continuous Infusions: . sodium chloride 10 mL/hr at 10/19/16 0730  . sodium chloride    . cefTRIAXone (ROCEPHIN)  IV Stopped (10/18/16 1430)  . feeding supplement (VITAL AF 1.2 CAL) 1,000 mL (10/19/16 0730)  . fentaNYL infusion INTRAVENOUS 125 mcg/hr (10/19/16 0730)  . furosemide (LASIX) infusion 10 mg/hr (10/19/16 0730)  . heparin 1,000 Units/hr (10/19/16 0730)  . norepinephrine (LEVOPHED) Adult infusion 7 mcg/min (10/19/16 0730)   PRN Meds:.sodium chloride, acetaminophen, albuterol, docusate, fentaNYL, midazolam, midazolam, nitroGLYCERIN, ondansetron (ZOFRAN) IV, sodium chloride flush, sodium chloride flush    Vitals:   10/19/16 0500 10/19/16 0600 10/19/16 0700 10/19/16 0730  BP:  111/70    Pulse: 80 76 77 77  Resp: (!) 24 (!) 24 (!) 21 (!) 24  Temp:    99.1 F (37.3 C)  TempSrc:    Axillary  SpO2: 97% 100% 100% 100%  Weight: 161 lb 6 oz (73.2 kg)     Height:        Intake/Output Summary (Last 24 hours) at 10/19/16 0739 Last data filed at 10/19/16  0600  Gross per 24 hour  Intake          3867.99 ml  Output             4910 ml  Net         -1042.01 ml    LABS: Basic Metabolic Panel:  Recent Labs  10/18/16 1712 10/19/16 0306  NA 132* 133*  K 2.8* 3.0*  CL 98* 94*  CO2 26 26  GLUCOSE 152* 162*  BUN 11 11  CREATININE 1.03 1.00  CALCIUM 7.1* 7.1*  MG 2.0 2.1  PHOS 1.8* 2.2*   Liver Function Tests: No results for input(s): AST, ALT, ALKPHOS, BILITOT, PROT, ALBUMIN in the last 72 hours. No results for input(s): LIPASE, AMYLASE in the last 72 hours. CBC:  Recent Labs  10/18/16 0416 10/19/16 0306  WBC 20.3* 19.7*  HGB 11.4* 11.1*  HCT 34.1* 34.5*  MCV 74.9* 75.5*  PLT 234 184   Cardiac Enzymes:  Recent Labs  10/16/16 0949 10/16/16 1133  TROPONINI 11.39* 25.67*   BNP: Invalid input(s): POCBNP D-Dimer: No results for input(s): DDIMER in the last 72 hours. Hemoglobin A1C:  Recent Labs  10/16/16 0949  HGBA1C 5.7*   Fasting Lipid Panel:  Recent Labs  10/17/16 0430  CHOL 165  HDL 38*  LDLCALC 105*  TRIG 108  CHOLHDL 4.3   Thyroid Function Tests:  Recent Labs  10/16/16 0949  TSH 1.828   Anemia Panel: No results for input(s): VITAMINB12, FOLATE, FERRITIN, TIBC, IRON, RETICCTPCT in the last 72 hours.  RADIOLOGY: Dg Chest Port 1 View  Result Date: 10/18/2016 CLINICAL DATA:  Post cardiac surgery. Subsequent encounter. Intubated patient. EXAM: PORTABLE CHEST 1 VIEW COMPARISON:  10/17/2016 FINDINGS: Mild interstitial and hazy airspace lung opacities predominating centrally are stable consistent with mild pulmonary edema. No new lung abnormalities. No pneumothorax. Endotracheal tube, left internal jugular central venous line, nasogastric tube and aortic balloon pump are stable and well positioned. IMPRESSION: 1. No change from the previous day's study. 2. Mild persistent pulmonary edema. 3. Support apparatus is stable and well positioned. Electronically Signed   By: Lajean Manes M.D.   On: 10/18/2016  07:43   Dg Chest Port 1 View  Result Date: 10/17/2016 CLINICAL DATA:  Status post CABG procedure. EXAM: PORTABLE CHEST 1 VIEW COMPARISON:  10/16/2016 FINDINGS: ET tube tip in satisfactory position above the carina. There is a left IJ catheter with tip at the SVC. NG tube tip is in the stomach. Stable position of ensure aortic balloon pump with marker at the level of the aortic arch. Normal heart size. Mild diffuse edema. IMPRESSION: 1. Stable appearance of support apparatus. 2. Mild diffuse edema. Electronically Signed   By: Kerby Moors M.D.   On: 10/17/2016 07:49   Dg Chest Port 1 View  Result Date: 10/16/2016 CLINICAL DATA:  Status post intubation EXAM: PORTABLE CHEST 1 VIEW COMPARISON:  10/16/2016 FINDINGS: Cardiac shadow is stable. Endotracheal tube, nasogastric catheter and left jugular central line are noted in satisfactory position. Intra-aortic balloon pump is noted just below the aortic knob. These are all new from the prior exam. Vascular congestion is noted with mild pulmonary edema. The overall appearance is similar to that seen on the prior exam. No pneumothorax is noted. IMPRESSION: Changes consistent with CHF stable from the previous study. Tubes and lines as described. Electronically Signed   By: Inez Catalina M.D.   On: 10/16/2016 12:18   Dg Chest Port 1 View  Result Date: 10/16/2016 CLINICAL DATA:  Code STEMI, chest pain. EXAM: PORTABLE CHEST 1 VIEW COMPARISON:  None in PACs FINDINGS: The lungs are adequately inflated. There is no focal infiltrate. The interstitial markings are mildly prominent. The heart is normal in size. The central pulmonary vascularity is prominent. The mediastinum is normal in width. There is no pleural effusion. The observed bony thorax is unremarkable. IMPRESSION: Mild interstitial prominence bilaterally may reflect low-grade pulmonary edema. No cardiomegaly or alveolar pneumonia. Electronically Signed   By: David  Martinique M.D.   On: 10/16/2016 07:16     PHYSICAL EXAM General: NAD, intubated Neck: JVP 8-9 cm, no thyromegaly or thyroid nodule.  Lungs: coarse breath sounds bilaterally.  CV: Nondisplaced PMI.  Heart regular S1/S2, no S3/S4, no murmur.  IABP sounds. No peripheral edema.   Abdomen: Soft, nontender, no hepatosplenomegaly, no distention.  Neurologic: Alert on vent, follows commands.  Extremities: No clubbing or cyanosis.   TELEMETRY: Personally reviewed telemetry pt in NSR, no ectopy  ASSESSMENT AND PLAN: Mr Mabey is 66 year old with no known PMH admitted with STEMI complicated by VT arrest and cardiogenic shock.  1. Cardiogenic shock: Echo with EF 20% with normal RV.  He is on IABP at 1:1 and norepinephrine, weaned down to 7  this morning. CVP 9-10, co-ox 68%.  Awake on vent.  - Good BP currently, will work on weaning off norepinephrine this morning.   - When norepinephrine off, transition IABP to 1:2 and check co-ox.  If ok, will plan removal of IABP.  Heparin continues while IABP in place.  - Continue Lasix gtt today (keeping about 1000 cc net negative given large amount of fluid coming in).   2. CAD: S/p anterior STEMI, DES placed.  Patient also has a chronically occluded RCA, symptoms suggest possible MI a year or more ago.   - Continue ASA, ticagrelor, statin.  3. Acute hypoxemic respiratory failure: He is intubated with pulmonary edema on CXR and possible aspiration PNA.  WBCs elevated, PCT has been elevated.  Cultures negative so far.  - Vancomycin stopped, complete 5 days abx with ceftriaxone per CCM.    - Would like to get IABP out then plan extubation.  4. VT arrest: Peri-MI.  - Stop amiodarone gtt today, start po amiodarone (hopefully stop completely prior to discharge).  - Lifevest at discharge.   CRITICAL CARE Performed by: Loralie Champagne  Total critical care time: 35 minutes  Critical care time was exclusive of separately billable procedures and treating other patients.  Critical care was necessary to  treat or prevent imminent or life-threatening deterioration.  Critical care was time spent personally by me on the following activities: development of treatment plan with patient and/or surrogate as well as nursing, discussions with consultants, evaluation of patient's response to treatment, examination of patient, obtaining history from patient or surrogate, ordering and performing treatments and interventions, ordering and review of laboratory studies, ordering and review of radiographic studies, pulse oximetry and re-evaluation of patient's condition.  Loralie Champagne 10/19/2016 7:45 AM

## 2016-10-19 NOTE — Progress Notes (Signed)
PULMONARY / CRITICAL CARE MEDICINE   Name: Gabriel Lopez MRN: [id number] DOB: 09/30/1950    ADMISSION DATE:  10/16/2016 CONSULTATION DATE:  10/16/2016  REFERRING MD:  Dr. Tresa Endo  CHIEF COMPLAINT:  Cardiac Arrest  HISTORY OF PRESENT ILLNESS:   Information obtained from thorough medical review since the patient is currently intubated and sedated.    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  He presented to the ED this am with complaints of progressively worse SSCP since 0400 with associated diaphoresis, nausea, and vomiting.  Per the family, he has not been feeling well for several days per his family.  In the ED, EKG showed an anterior STEMI.   He went into VF to ?Torsades arrest, treated with CPR, epi, defibrillated 5 times, and amiodarone for 10 mins prior to ROSC,  Afterwards, he became uncooperative and verbally belligerent and therefore intubated for airway protection and taken to the Cath lab.    In the cath lab, he was stented x 2 for LAD occulusion, placed on IABP and vasopressors for cardiogenic shock.  He was intermittently agitated during procedure.  PCCM consulted.     SUBJECTIVE: Levophed down to 2.  IABP still on 1:1.  Plan is to hopefully wean off levo and IABP today. -4L in past 24 hrs.   VITAL SIGNS: BP 111/70   Pulse 77   Temp 99.1 F (37.3 C) (Axillary)   Resp (!) 24   Ht 5\' 5"  (1.651 m)   Wt 73.2 kg (161 lb 6 oz)   SpO2 100%   BMI 26.85 kg/m   HEMODYNAMICS: CVP:  [8 mmHg-13 mmHg] 9 mmHg  VENTILATOR SETTINGS: Vent Mode: PRVC FiO2 (%):  [40 %] 40 % Set Rate:  [24 bmp-35 bmp] 24 bmp Vt Set:  [530 mL] 530 mL PEEP:  [5 cmH20] 5 cmH20 Plateau Pressure:  [18 cmH20-21 cmH20] 20 cmH20  INTAKE / OUTPUT: I/O last 3 completed shifts: In: 6746 [I.V.:2431; OZ/HY:8657; IV Piggyback:1730] Out: 6210 [Urine:6210]  PHYSICAL EXAMINATION: General:  Older than stated age, awake  but comfortable HEENT: MM pink/moist, ETT, multiple facial piercings  Neuro: Awake despite sedation but remains comfortable, follows commands CV: s1s2 rrr, mechanical sounds from IABP PULM: even/non-labored on MV, lungs bilaterally clear, diminished in the bases QI:ONGE, non-tender, mechanical sounds, bilateral inguinal hernia Extremities: warm/dry, no edema  Skin: no rashes or lesions, multiple tattoos    LABS:  BMET  Recent Labs Lab 10/18/16 0416 10/18/16 1712 10/19/16 0306  NA 132* 132* 133*  K 3.3* 2.8* 3.0*  CL 101 98* 94*  CO2 20* 26 26  BUN 14 11 11   CREATININE 1.05 1.03 1.00  GLUCOSE 158* 152* 162*    Electrolytes  Recent Labs Lab 10/18/16 0416 10/18/16 1712 10/19/16 0306  CALCIUM 7.2* 7.1* 7.1*  MG 2.4 2.0 2.1  PHOS 2.4* 1.8* 2.2*    CBC  Recent Labs Lab 10/17/16 0430 10/18/16 0416 10/19/16 0306  WBC 26.5* 20.3* 19.7*  HGB 13.0 11.4* 11.1*  HCT 40.7 34.1* 34.5*  PLT 299 234 184    Coag's  Recent Labs Lab 10/16/16 0652 10/16/16 1458  APTT 30  --   INR 0.96 1.59    Sepsis Markers  Recent Labs Lab 10/16/16 1133 10/16/16 1436 10/16/16 2057 10/17/16 0430 10/18/16 0416  LATICACIDVEN  --  2.6* 2.5* 2.1*  --   PROCALCITON 0.24  --   --  3.56 2.77    ABG  Recent Labs Lab 10/18/16 0837 10/18/16 1313 10/19/16 0342  PHART 7.460* 7.519* 7.507*  PCO2ART 31.3* 29.9* 36.5  PO2ART 85.0 114.0* 327*    Liver Enzymes  Recent Labs Lab 10/16/16 0652  AST 37  ALT 23  ALKPHOS 71  BILITOT 0.5  ALBUMIN 3.9    Cardiac Enzymes  Recent Labs Lab 10/16/16 0652 10/16/16 0949 10/16/16 1133  TROPONINI 0.77* 11.39* 25.67*    Glucose  Recent Labs Lab 10/18/16 0753 10/18/16 1238 10/18/16 1257 10/18/16 1543 10/18/16 1921 10/19/16 0339  GLUCAP 156* 130* 145* 166* 142* 160*    Imaging No results found.   STUDIES:  EKG 5/1 > anterior STEMI Cardiac Cath 5/1 > RCA 80% stenosed, mid RCA lesion one 95% stenosed, mid RCA  lesion two 100% stenosed, prox LAD to mid LAD lesion 100% sten2osed > DES placed, IABP placed. PCXR 5/1 > CHF. Echo 5/2 >  EF 20%, moderate LVH, mid MR. PAP 34.  CULTURES: BC x 2 5/1 > [place] 5/1 >  ANTIBIOTICS:  SIGNIFICANT EVENTS: 5/1 > Admit STEMI/ Cardiac arrest/ cath lab stent x 2/ IABP  LINES/TUBES: ETT 5/1 R sheath 5/1 L IJ CVL 5/1 >   DISCUSSION: 39 yoM admitted with anterior STEMI, progressed to VF/ ? torsedes arrest,  epi, shocked 5 times, amio/lido, CPR x 10 mins before ROSC.  Intubated afterwards airway protection and combativeness.  Taken to the Cath lab, stented x 2 to LAD and IABP placed.    ASSESSMENT / PLAN:  PULMONARY A: Acute Respiratory Failure - in the setting of STEMI/ Cardiac Arrest Respiratory acidosis - vent settings adjusted 5/1 though remained slightly acidemic.  5/4 mildly alkalotic. Acute pulmonary edema P:   Assess ABG VAP protocol Hold SBT for now given continued IABP support, pressors, etc.  Can potentially try this afternoon vs AM 5/5 if pressors and IABP off Daily CXR Continue lasix gtt, goal neg balanace  CARDIOVASCULAR A:  Anterior STEMI - s/p 2 stents to LAD.  VF Cardiac Arrest - ? Torsades - down 10 min prior to ROSC.  Started on normothermia which was d/c'd 5/2 as pt was awake and following commands. Cardiogenic Shock - IABP 5/1.  Co-ox 61. Newly diagnosed severe sCHF - EF 20%. P:  Continue IABP per Cards, hope to wean to off today Continue levophed, goal MAP > 65 (hope to wean off today) ? Whether needs additional IV inotropic support vs adjusting IABP ratio - will defer to cards Would get advanced heart failure team on board Continue Amio gtt, ASA, brilinta, dig per Cards Life vest at discharge  RENAL A:   Hyponatremia - volume overload. Hypokalemia - s/p repletion. Hypophosphatemia. P:   Goal Mg > 2, K > 4 Continue lasix gtt, goal neg balance Kphos now BMP q12 hrs  GASTROINTESTINAL A:   GI prophylaxis P:    SUP: Pantoprazole  NPO ContinueTF's  HEMATOLOGIC A:   VTE prophylaxis P:   SCD's / Heparin CBC daily  INFECTIOUS A:   Leukocytosis- initially thought to be reactive; however slight rise this AM and PCT 3.6. P:   CTX on board, pct trend down, consider 5 days  Follow cultures  ENDOCRINE A:   No acute issues  P:   SSI if glucose consistently > 180  NEUROLOGIC A:   Acute encephalopathy- in the setting of cardiac arrest  P:   RASS goal: 0 to -1 Fentanyl gtt / versed for PAD protocol Daily WUA Continue  thiamine / folic acid  FAMILY  - Updates: Son in law updated at bedside 5/3.   - Inter-disciplinary family meet or Palliative Care meeting due by:  Oct 23, 2016  CC time: 30 mins    Rutherford Guys, [place] [facility] Pager: [phone]  or [phone] 10/19/2016, 7:58 AM

## 2016-10-19 NOTE — Progress Notes (Signed)
Patient's ALINE dressing changed without complication.

## 2016-10-19 NOTE — Progress Notes (Signed)
eLink Physician-Brief Progress Note Patient Name: Gabriel Lopez DOB: [birth year] MRN: [id number]   Date of Service  10/19/2016  HPI/Events of Note    eICU Interventions  Hypokalemia -repleted      Intervention Category Intermediate Interventions: Electrolyte abnormality - evaluation and management  ALVA,RAKESH V. 10/19/2016, 4:01 AM

## 2016-10-19 NOTE — Progress Notes (Signed)
ANTICOAGULATION CONSULT NOTE   Pharmacy Consult for Heparin Indication: IABP  No Known Allergies  Patient Measurements: Height: 5' 5"  (165.1 cm) Weight: 160 lb 15 oz (73 kg) IBW/kg (Calculated) : 61.5 Heparin Dosing Weight: 71.5kg  Vital Signs: Temp: 99.7 F (37.6 C) (05/04 0400) Temp Source: Oral (05/04 0400) BP: 117/65 (05/04 0400) Pulse Rate: 78 (05/04 0400)  Labs:  Recent Labs  10/16/16 2956  10/16/16 0949 10/16/16 1133  10/16/16 1458  10/17/16 0430  10/17/16 2324 10/18/16 0416 10/18/16 0420 10/18/16 1712 10/19/16 0306  HGB 15.2  < >  --   --   --   --   --  13.0  --   --  11.4*  --   --  11.1*  HCT 46.1  < >  --   --   --   --   --  40.7  --   --  34.1*  --   --  34.5*  PLT 377  --   --   --   --   --   --  299  --   --  234  --   --  184  APTT 30  --   --   --   --   --   --   --   --   --   --   --   --   --   LABPROT 12.8  --   --   --   --  19.1*  --   --   --   --   --   --   --   --   INR 0.96  --   --   --   --  1.59  --   --   --   --   --   --   --   --   HEPARINUNFRC  --   --   --   --   --   --   < > 0.49  < > 0.31  --  0.31  --  0.15*  CREATININE 1.06  < >  --   --   < >  --   < > 1.22  < > 1.10 1.05  --  1.03 1.00  TROPONINI 0.77*  --  11.39* 25.67*  --   --   --   --   --   --   --   --   --   --   < > = values in this interval not displayed.  Estimated Creatinine Clearance: 63.2 mL/min (by C-G formula based on SCr of 1 mg/dL).   Medical History: No past medical history on file.  Medications:  Prescriptions Prior to Admission  Medication Sig Dispense Refill Last Dose  . aspirin EC 81 MG tablet Take 81 mg by mouth daily.   10/16/2016 at Unknown  . ranitidine (ZANTAC) 75 MG tablet Take 150 mg by mouth daily as needed for heartburn.   Unknown at Unknown   Scheduled:  . aspirin  81 mg Oral Daily  . atorvastatin  80 mg Oral q1800  . chlorhexidine gluconate (MEDLINE KIT)  15 mL Mouth Rinse BID  . Chlorhexidine Gluconate Cloth  6 each Topical  Daily  . digoxin  0.125 mg Per Tube Daily  . fentaNYL (SUBLIMAZE) injection  50 mcg Intravenous Once  . folic acid  1 mg Per Tube Daily  . insulin aspart  0-9 Units Subcutaneous Q4H  . mouth  rinse  15 mL Mouth Rinse 10 times per day  . pantoprazole sodium  40 mg Per Tube Daily  . potassium chloride  40 mEq Per Tube Q4H  . sodium chloride flush  10-40 mL Intracatheter Q12H  . sodium chloride flush  3 mL Intravenous Q12H  . thiamine  100 mg Per Tube Daily  . ticagrelor  90 mg Oral BID   Infusions:  . sodium chloride 10 mL/hr at 10/18/16 1609  . sodium chloride    . amiodarone    . amiodarone 30 mg/hr (10/18/16 2158)  . cefTRIAXone (ROCEPHIN)  IV Stopped (10/18/16 1430)  . feeding supplement (VITAL AF 1.2 CAL) 1,000 mL (10/19/16 0152)  . fentaNYL infusion INTRAVENOUS 125 mcg/hr (10/19/16 0151)  . furosemide (LASIX) infusion 10 mg/hr (10/18/16 2321)  . heparin 900 Units/hr (10/18/16 2147)  . norepinephrine (LEVOPHED) Adult infusion 10 mcg/min (10/19/16 0224)    Assessment: Patient is a 66 year old male admitted 10/16/16 as code STEMI s/p VT and VF w/ CPR in the ED on arrival. In cath, he was stented x2 to LAD and had IABP placed.  Heparin to continue while IABP in use.  Heparin level subtherapeutic following rewarming: 0.15 Hgb remains low but stable. RN reports no bleeding or infusion related issues.  Goal of Therapy:  Heparin level 0.2-0.5 units/ml Monitor platelets by anticoagulation protocol: Yes   Plan:  Increase heparin gtt to 1000 units/hr Daily heparin level and CBC Monitor signs/symptoms of bleeding  Georga Bora, PharmD Clinical Pharmacist Pager: [phone] 10/19/2016 4:12 AM

## 2016-10-19 NOTE — Progress Notes (Signed)
Site area: Rt fem art and  rt fem venous sheath removed Site Prior to Removal:  Level 0 Pressure Applied For: Manual:   yes Patient Status During Pull:  Pt is intubated, alert and oriented Pull Site:  Level 0 Post Pull Instructions Given:  Post instructions given. Pt nodded head that he understood Post Pull Pulses Present: RT dp/Rt pt doppler and can be palpated at 1+ rt dp/rt pt  Dressing Applied:  Tegaderm and a 4x4  Bedrest begins @ 17:00:00 Comments: Millie-RN evaluated rt groin as level 0. Rt leg immobilizer reapplied. Rt groin is stable, no bleeding or hematoma. Rt groin dressing is CDI.

## 2016-10-19 NOTE — Progress Notes (Signed)
ANTICOAGULATION CONSULT NOTE   Pharmacy Consult for Heparin Indication: IABP  No Known Allergies  Patient Measurements: Height: 5' 5"  (165.1 cm) Weight: 161 lb 6 oz (73.2 kg) IBW/kg (Calculated) : 61.5 Heparin Dosing Weight: 71.5kg  Vital Signs: Temp: 98.8 F (37.1 C) (05/04 1200) Temp Source: Axillary (05/04 1200) BP: 105/59 (05/04 1234) Pulse Rate: 85 (05/04 1234)  Labs:  Recent Labs  10/16/16 1458  10/17/16 0430  10/18/16 0416 10/18/16 0420 10/18/16 1712 10/19/16 0306 10/19/16 1136  HGB  --   < > 13.0  --  11.4*  --   --  11.1*  --   HCT  --   --  40.7  --  34.1*  --   --  34.5*  --   PLT  --   --  299  --  234  --   --  184  --   LABPROT 19.1*  --   --   --   --   --   --   --   --   INR 1.59  --   --   --   --   --   --   --   --   HEPARINUNFRC  --   < > 0.49  < >  --  0.31  --  0.15* 0.22*  CREATININE  --   < > 1.22  < > 1.05  --  1.03 1.00  --   < > = values in this interval not displayed.  Estimated Creatinine Clearance: 63.2 mL/min (by C-G formula based on SCr of 1 mg/dL).   Medical History: No past medical history on file.  Medications:  Prescriptions Prior to Admission  Medication Sig Dispense Refill Last Dose  . aspirin EC 81 MG tablet Take 81 mg by mouth daily.   10/16/2016 at Unknown  . ranitidine (ZANTAC) 75 MG tablet Take 150 mg by mouth daily as needed for heartburn.   Unknown at Unknown   Scheduled:  . amiodarone  200 mg Per Tube BID  . [START ON 10/20/2016] aspirin  81 mg Per Tube Daily  . atorvastatin  80 mg Per Tube q1800  . chlorhexidine gluconate (MEDLINE KIT)  15 mL Mouth Rinse BID  . Chlorhexidine Gluconate Cloth  6 each Topical Daily  . digoxin  0.125 mg Per Tube Daily  . folic acid  1 mg Per Tube Daily  . insulin aspart  0-9 Units Subcutaneous Q4H  . mouth rinse  15 mL Mouth Rinse 10 times per day  . pantoprazole sodium  40 mg Per Tube Daily  . potassium chloride  40 mEq Oral Once  . sodium chloride flush  10-40 mL Intracatheter  Q12H  . sodium chloride flush  3 mL Intravenous Q12H  . thiamine  100 mg Per Tube Daily  . ticagrelor  90 mg Oral BID   Infusions:  . sodium chloride 10 mL/hr at 10/19/16 0730  . cefTRIAXone (ROCEPHIN)  IV Stopped (10/19/16 1216)  . feeding supplement (VITAL AF 1.2 CAL) 1,000 mL (10/19/16 0730)  . fentaNYL infusion INTRAVENOUS 50 mcg/hr (10/19/16 1000)  . furosemide (LASIX) infusion 10 mg/hr (10/19/16 0730)  . heparin 1,000 Units/hr (10/19/16 0730)  . norepinephrine (LEVOPHED) Adult infusion Stopped (10/19/16 0800)  . potassium phosphate IVPB (mmol) 30 mmol (10/19/16 0856)    Assessment: Patient is a 66 year old male admitted 10/16/16 as code STEMI s/p VT and VF w/ CPR in the ED on arrival. In cath, he was stented  x2 to LAD and had IABP placed.  Heparin to continue while IABP in use.  Heparin level at noon was therapeutic at 0.22 units/mL. Hgb remains low but stable. RN reports no bleeding or infusion related issues. Per RN, heparin will be shut off at 1500 today for IABP removal. Patient did not have indication for anticoagulation other than IABP noted.   Goal of Therapy:  Heparin level 0.2-0.5 units/ml Monitor platelets by anticoagulation protocol: Yes   Plan:  Continue heparin gtt to 1000 units/hr until 1500 on 5/4 F/U IABP removal   Demetrius Charity, PharmD Acute Care Pharmacy Resident  Pager: [phone] 10/19/2016

## 2016-10-20 ENCOUNTER — Inpatient Hospital Stay (HOSPITAL_COMMUNITY): Payer: Medicare Other

## 2016-10-20 LAB — GLUCOSE, CAPILLARY
GLUCOSE-CAPILLARY: 157 mg/dL — AB (ref 65–99)
GLUCOSE-CAPILLARY: 104 mg/dL — AB (ref 65–99)
Glucose-Capillary: 114 mg/dL — ABNORMAL HIGH (ref 65–99)
Glucose-Capillary: 87 mg/dL (ref 65–99)

## 2016-10-20 LAB — BASIC METABOLIC PANEL
Anion gap: 12 (ref 5–15)
Anion gap: 12 (ref 5–15)
BUN: 17 mg/dL (ref 6–20)
BUN: 17 mg/dL (ref 6–20)
CALCIUM: 7.9 mg/dL — AB (ref 8.9–10.3)
CHLORIDE: 93 mmol/L — AB (ref 101–111)
CO2: 29 mmol/L (ref 22–32)
CO2: 27 mmol/L (ref 22–32)
Calcium: 8.3 mg/dL — ABNORMAL LOW (ref 8.9–10.3)
Chloride: 93 mmol/L — ABNORMAL LOW (ref 101–111)
Creatinine, Ser: 1.06 mg/dL (ref 0.61–1.24)
Creatinine, Ser: 1.01 mg/dL (ref 0.61–1.24)
GFR calc non Af Amer: >60 mL/min (ref >60)
GFR calc non Af Amer: >60 mL/min (ref >60)
GLUCOSE: 139 mg/dL — AB (ref 65–99)
Glucose, Bld: 95 mg/dL (ref 65–99)
POTASSIUM: 3.8 mmol/L (ref 3.5–5.1)
Potassium: 3.4 mmol/L — ABNORMAL LOW (ref 3.5–5.1)
Sodium: 134 mmol/L — ABNORMAL LOW (ref 135–145)
Sodium: 132 mmol/L — ABNORMAL LOW (ref 135–145)

## 2016-10-20 LAB — CBC
HEMATOCRIT: 34.3 % — AB (ref 39.0–52.0)
HEMOGLOBIN: 11.2 g/dL — AB (ref 13.0–17.0)
MCH: 24.8 pg — ABNORMAL LOW (ref 26.0–34.0)
MCHC: 32.7 g/dL (ref 30.0–36.0)
MCV: 76.1 fL — ABNORMAL LOW (ref 78.0–100.0)
Platelets: 198 10*3/uL (ref 150–400)
RBC: 4.51 MIL/uL (ref 4.22–5.81)
RDW: 14.1 % (ref 11.5–15.5)
WBC: 16.4 10*3/uL — AB (ref 4.0–10.5)

## 2016-10-20 LAB — COOXEMETRY PANEL
Carboxyhemoglobin: 0.8 % (ref 0.5–1.5)
METHEMOGLOBIN: 1 % (ref 0.0–1.5)
O2 Saturation: 60.4 %
Total hemoglobin: 11.6 g/dL — ABNORMAL LOW (ref 12.0–16.0)

## 2016-10-20 LAB — PHOSPHORUS: PHOSPHORUS: 2.3 mg/dL — AB (ref 2.5–4.6)

## 2016-10-20 LAB — MAGNESIUM: Magnesium: 2.3 mg/dL (ref 1.7–2.4)

## 2016-10-20 MED ORDER — PANTOPRAZOLE SODIUM 40 MG PO TBEC
40.0000 mg | DELAYED_RELEASE_TABLET | Freq: Every day | ORAL | Status: DC
  Administered 2016-10-22 – 2016-10-23 (×2): 40 mg via ORAL
  Filled 2016-10-20 (×2): qty 1

## 2016-10-20 MED ORDER — SENNOSIDES-DOCUSATE SODIUM 8.6-50 MG PO TABS
2.0000 | ORAL_TABLET | Freq: Every day | ORAL | Status: DC
  Administered 2016-10-20 – 2016-10-23 (×2): 2 via ORAL
  Filled 2016-10-20 (×3): qty 2

## 2016-10-20 MED ORDER — POTASSIUM PHOSPHATES 15 MMOLE/5ML IV SOLN
10.0000 mmol | Freq: Once | INTRAVENOUS | Status: AC
  Administered 2016-10-20: 10 mmol via INTRAVENOUS
  Filled 2016-10-20: qty 3.33

## 2016-10-20 MED ORDER — POTASSIUM CHLORIDE 20 MEQ/15ML (10%) PO SOLN
ORAL | Status: AC
  Filled 2016-10-20: qty 30

## 2016-10-20 MED ORDER — POTASSIUM CHLORIDE 20 MEQ/15ML (10%) PO SOLN
40.0000 meq | Freq: Once | ORAL | Status: AC
  Administered 2016-10-20: 40 meq

## 2016-10-20 MED ORDER — VITAMIN B-1 100 MG PO TABS
100.0000 mg | ORAL_TABLET | Freq: Every day | ORAL | Status: DC
  Administered 2016-10-21 – 2016-10-23 (×3): 100 mg via ORAL
  Filled 2016-10-20 (×3): qty 1

## 2016-10-20 MED ORDER — AMIODARONE HCL 200 MG PO TABS
200.0000 mg | ORAL_TABLET | Freq: Two times a day (BID) | ORAL | Status: DC
  Administered 2016-10-20 – 2016-10-21 (×2): 200 mg via ORAL
  Filled 2016-10-20 (×2): qty 1

## 2016-10-20 MED ORDER — DIGOXIN 125 MCG PO TABS
0.1250 mg | ORAL_TABLET | Freq: Every day | ORAL | Status: DC
  Administered 2016-10-21 – 2016-10-23 (×3): 0.125 mg via ORAL
  Filled 2016-10-20 (×3): qty 1

## 2016-10-20 MED ORDER — ASPIRIN 81 MG PO CHEW
81.0000 mg | CHEWABLE_TABLET | Freq: Every day | ORAL | Status: DC
  Administered 2016-10-20 – 2016-10-23 (×4): 81 mg via ORAL
  Filled 2016-10-20 (×4): qty 1

## 2016-10-20 MED ORDER — DOCUSATE SODIUM 50 MG/5ML PO LIQD
100.0000 mg | Freq: Two times a day (BID) | ORAL | Status: DC | PRN
  Filled 2016-10-20: qty 10

## 2016-10-20 MED ORDER — FOLIC ACID 1 MG PO TABS
1.0000 mg | ORAL_TABLET | Freq: Every day | ORAL | Status: DC
  Administered 2016-10-21 – 2016-10-23 (×3): 1 mg via ORAL
  Filled 2016-10-20 (×3): qty 1

## 2016-10-20 MED ORDER — SPIRONOLACTONE 25 MG PO TABS
12.5000 mg | ORAL_TABLET | Freq: Every day | ORAL | Status: DC
  Administered 2016-10-20: 12.5 mg via ORAL
  Administered 2016-10-21: 10:00:00 via ORAL
  Administered 2016-10-22: 12.5 mg via ORAL
  Filled 2016-10-20 (×3): qty 1

## 2016-10-20 MED ORDER — ATORVASTATIN CALCIUM 80 MG PO TABS
80.0000 mg | ORAL_TABLET | Freq: Every day | ORAL | Status: DC
  Administered 2016-10-20 – 2016-10-22 (×3): 80 mg via ORAL
  Filled 2016-10-20 (×3): qty 1

## 2016-10-20 NOTE — Procedures (Signed)
Extubation Procedure Note  Patient Details:   Name: Gabriel Lopez DOB: 08/04/1950 MRN: [id number]   Airway Documentation:     Evaluation  O2 sats: stable throughout Complications: No apparent complications Patient did tolerate procedure well. Bilateral Breath Sounds: Diminished, Rhonchi   Pt extubated per MD order. Pt had +cuff leak, placed on 4L [place] tolerating well, no distress noted at this time.  Pt has strong cough and able to voice.   Cherylin Mylar 10/20/2016, 8:53 AM

## 2016-10-20 NOTE — Progress Notes (Signed)
PULMONARY / CRITICAL CARE MEDICINE   Name: Celestine Laviola MRN: [id number] DOB: [birth year]    ADMISSION DATE:  10/16/2016 CONSULTATION DATE:  10/16/2016  REFERRING MD:  Dr. 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  He went into VF to ?Torsades arrest, treated with CPR, epi, defibrillated 5 times, and amiodarone for 10 mins prior to ROSC,   In the cath lab, he was stented x 2 for LAD occulusion, placed on IABP and vasopressors for cardiogenic shock.    SUBJECTIVE: Critically ill, intubated On lasix gtt Denies pain   VITAL SIGNS: BP 107/71   Pulse 95   Temp 99.3 F (37.4 C) (Oral)   Resp 10   Ht 5\' 5"  (1.651 m)   Wt 153 lb 7 oz (69.6 kg)   SpO2 98%   BMI 25.53 kg/m   HEMODYNAMICS: CVP:  [6 mmHg-8 mmHg] 6 mmHg  VENTILATOR SETTINGS: Vent Mode: PSV;CPAP FiO2 (%):  [40 %] 40 % Set Rate:  [24 bmp] 24 bmp Vt Set:  [530 mL] 530 mL PEEP:  [5 cmH20] 5 cmH20 Pressure Support:  [5 cmH20] 5 cmH20 Plateau Pressure:  [16 cmH20-18 cmH20] 17 cmH20  INTAKE / OUTPUT: I/O last 3 completed shifts: In: 5370 [I.V.:1460; NG/GT:2840; IV Piggyback:1070] Out: 7445 [Urine:7445]  PHYSICAL EXAMINATION: General:  Older than stated age, awake but comfortable HEENT: MM pink/moist, ETT, multiple facial piercings  Neuro: Awake , follows commands, non focal CV: s1s2 rrr, mechanical sounds from IABP PULM: even/non-labored on MV, lungs bilaterally clear, diminished in the bases GY:FVCB, non-tender, mechanical sounds, bilateral inguinal hernia Extremities: warm/dry, no edema  Skin: no rashes or lesions, multiple tattoos    LABS:  BMET  Recent Labs Lab 10/19/16 0306 10/19/16 1555 10/20/16 0510  NA 133* 133* 134*  K 3.0* 4.7 3.4*  CL 94* 95* 93*  CO2 26 27 29   BUN 11 13 17   CREATININE 1.00 0.97 1.06  GLUCOSE 162* 118* 139*    Electrolytes  Recent Labs Lab  10/19/16 0306 10/19/16 1555 10/20/16 0510  CALCIUM 7.1* 7.4* 7.9*  MG 2.1 2.1 2.3  PHOS 2.2* 2.8 2.3*    CBC  Recent Labs Lab 10/18/16 0416 10/19/16 0306 10/20/16 0510  WBC 20.3* 19.7* 16.4*  HGB 11.4* 11.1* 11.2*  HCT 34.1* 34.5* 34.3*  PLT 234 184 198    Coag's  Recent Labs Lab 10/16/16 0652 10/16/16 1458  APTT 30  --   INR 0.96 1.59    Sepsis Markers  Recent Labs Lab 10/16/16 1133 10/16/16 1436 10/16/16 2057 10/17/16 0430 10/18/16 0416  LATICACIDVEN  --  2.6* 2.5* 2.1*  --   PROCALCITON 0.24  --   --  3.56 2.77    ABG  Recent Labs Lab 10/18/16 0837 10/18/16 1313 10/19/16 0342  PHART 7.460* 7.519* 7.507*  PCO2ART 31.3* 29.9* 36.5  PO2ART 85.0 114.0* 327*    Liver Enzymes  Recent Labs Lab 10/16/16 0652  AST 37  ALT 23  ALKPHOS 71  BILITOT 0.5  ALBUMIN 3.9    Cardiac Enzymes  Recent Labs Lab 10/16/16 0652 10/16/16 0949 10/16/16 1133  TROPONINI 0.77* 11.39* 25.67*    Glucose  Recent Labs Lab 10/19/16 0727 10/19/16 1145 10/19/16 1603 10/19/16 1948 10/19/16 2323 10/20/16 0425  GLUCAP 150* 150* 122* 104* 116* 157*    Imaging Dg Chest Port 1 View  Result Date: 10/20/2016 CLINICAL DATA:  Respiratory failure. EXAM: PORTABLE CHEST 1 VIEW COMPARISON:  Radiograph of Oct 19, 2016. FINDINGS: Endotracheal and nasogastric tubes are in grossly good position. Left internal jugular catheter is stable with distal tip in expected position of the SVC. Stable cardiomediastinal silhouette. Mild central pulmonary vascular congestion is noted. No pneumothorax is noted. No significant pleural effusion is noted. Bony thorax is unremarkable. Stable mild bibasilar atelectasis is noted. IMPRESSION: Stable support apparatus. Mild central pulmonary vascular congestion. Stable mild bibasilar subsegmental atelectasis. Electronically Signed   By: Lupita Raider, M.D.   On: 10/20/2016 07:45     STUDIES:  EKG 5/1 > anterior STEMI Cardiac Cath 5/1 > RCA  80% stenosed, mid RCA lesion one 95% stenosed, mid RCA lesion two 100% stenosed, prox LAD to mid LAD lesion 100% sten2osed > DES placed, IABP placed. PCXR 5/1 > CHF. Echo 5/2 >  EF 20%, moderate LVH, mid MR. PAP 34.  CULTURES: BC x 2 5/1 >ng [place] 5/1 >  ANTIBIOTICS:  SIGNIFICANT EVENTS: 5/1 > Admit STEMI/ Cardiac arrest/ cath lab stent x 2/ IABP  LINES/TUBES: ETT 5/1 R sheath 5/1 >> 5/4 L IJ CVL 5/1 >   DISCUSSION: 32 yoM admitted with anterior STEMI, progressed to VF/ ? torsedes arrest, stented x 2 to LAD   ASSESSMENT / PLAN:  PULMONARY A: Acute Respiratory Failure - in the setting of STEMI/ Cardiac Arrest Respiratory acidosis - vent settings adjusted 5/1 though remained slightly acidemic.  5/4 mildly alkalotic. Acute pulmonary edema P:   Weans well on PS 5/5 Extubate  CARDIOVASCULAR A:  Anterior STEMI - s/p 2 stents to LAD.  VF Cardiac Arrest - ? Torsades - down 10 min prior to ROSC.  Started on normothermia which was d/c'd 5/2 as pt was awake and following commands. Cardiogenic Shock - IABP 5/1.  Co-ox 61. Newly diagnosed severe sCHF - EF 20%. P:   IABP off levophed off  advanced heart failure team following Continue Amio , ASA, brilinta, dig per Cards Life vest at discharge  RENAL A:   Hyponatremia - volume overload. Hypokalemia - s/p repletion. Hypophosphatemia. P:   Goal Mg > 2, K > 4 Can dc  lasix gtt, goal neg balance Kphos now BMP q12 hrs  GASTROINTESTINAL A:   GI prophylaxis P:   SUP: Pantoprazole  NPO ContinueTF's  HEMATOLOGIC A:   VTE prophylaxis P:   SCD's / Heparin CBC daily  INFECTIOUS A:   Leukocytosis- initially thought to be reactive; however slight rise this AM and PCT 3.6. P:   CTX on board, pct trend down, consider 5 days  Follow cultures  ENDOCRINE A:   No acute issues  P:   SSI if glucose consistently > 180  NEUROLOGIC A:   Acute encephalopathy- in the setting of cardiac arrest  P:   RASS goal: 0  PAD  protocol PT consult once extubated Continue thiamine / folic acid  FAMILY  - Updates: wife updated at bedside  - Inter-disciplinary family meet or Palliative Care meeting due by:  Oct 23, 2016  CC time: 32 mins    Cyril Mourning MD. Tonny Bollman. [place] [facility] Pager [phone] If no response call [phone]    10/20/2016, 8:33 AM

## 2016-10-20 NOTE — Progress Notes (Signed)
CARDIAC REHAB PHASE I   PRE:  Rate/Rhythm: 93 sr  BP:  Sitting: 119/83     SaO2: would not read 2 liters  MODE:  Ambulation: 256 ft   POST:  Rate/Rhythm: 96 sr  BP:  Sitting: 126/83     SaO2: would not read 2 liters  1:35p-2:25p Patient ambulated steady with use of walker and x2 assist. Did not need rest break. Eager to get moving. Voice is raspy. Complained of chest and bottom soreness. Nurse in room assisting to bathroom.  Barnabas Lister Keaton Stirewalt, MS 10/20/2016 2:23 PM

## 2016-10-20 NOTE — Progress Notes (Addendum)
Patient ID: Gabriel Lopez, male   DOB: 04/02/1951, 66 y.o.   MRN: [id number]   ADVANCED HF ROUNDING NOTE   SUBJECTIVE:   IABP pulled yesterday.  Off norepi. Extubated this am. Co-ox 60% CVP 2.  SBP 110-120  Awake. Smiling. Raspy voice. Denies CP or SOB   Scheduled Meds: . amiodarone  200 mg Per Tube BID  . aspirin  81 mg Per Tube Daily  . atorvastatin  80 mg Per Tube q1800  . chlorhexidine gluconate (MEDLINE KIT)  15 mL Mouth Rinse BID  . Chlorhexidine Gluconate Cloth  6 each Topical Daily  . digoxin  0.125 mg Per Tube Daily  . folic acid  1 mg Per Tube Daily  . heparin subcutaneous  5,000 Units Subcutaneous Q8H  . insulin aspart  0-9 Units Subcutaneous Q4H  . mouth rinse  15 mL Mouth Rinse 10 times per day  . pantoprazole sodium  40 mg Per Tube Daily  . sodium chloride flush  10-40 mL Intracatheter Q12H  . sodium chloride flush  3 mL Intravenous Q12H  . thiamine  100 mg Per Tube Daily  . ticagrelor  90 mg Oral BID   Continuous Infusions: . sodium chloride 10 mL/hr at 10/19/16 2000  . cefTRIAXone (ROCEPHIN)  IV    . furosemide (LASIX) infusion 10 mg/hr (10/19/16 2101)  . potassium phosphate IVPB (mmol) 10 mmol (10/20/16 1010)   PRN Meds:.acetaminophen (TYLENOL) oral liquid 160 mg/5 mL, albuterol, docusate, nitroGLYCERIN, ondansetron (ZOFRAN) IV, sodium chloride flush, sodium chloride flush    Vitals:   10/20/16 0800 10/20/16 0845 10/20/16 0852 10/20/16 0854  BP: 107/71     Pulse: 95   98  Resp:    18  Temp:  99.3 F (37.4 C)    TempSrc:  Axillary    SpO2: 98%  97% 96%  Weight:      Height:        Intake/Output Summary (Last 24 hours) at 10/20/16 1036 Last data filed at 10/20/16 0800  Gross per 24 hour  Intake          2691.25 ml  Output             4620 ml  Net         -1928.75 ml    LABS: Basic Metabolic Panel:  Recent Labs  10/19/16 1555 10/20/16 0510  NA 133* 134*  K 4.7 3.4*  CL 95* 93*  CO2 27 29  GLUCOSE 118* 139*  BUN 13 17  CREATININE  0.97 1.06  CALCIUM 7.4* 7.9*  MG 2.1 2.3  PHOS 2.8 2.3*   Liver Function Tests: No results for input(s): AST, ALT, ALKPHOS, BILITOT, PROT, ALBUMIN in the last 72 hours. No results for input(s): LIPASE, AMYLASE in the last 72 hours. CBC:  Recent Labs  10/19/16 0306 10/20/16 0510  WBC 19.7* 16.4*  HGB 11.1* 11.2*  HCT 34.5* 34.3*  MCV 75.5* 76.1*  PLT 184 198   Cardiac Enzymes: No results for input(s): CKTOTAL, CKMB, CKMBINDEX, TROPONINI in the last 72 hours. BNP: Invalid input(s): POCBNP D-Dimer: No results for input(s): DDIMER in the last 72 hours. Hemoglobin A1C: No results for input(s): HGBA1C in the last 72 hours. Fasting Lipid Panel: No results for input(s): CHOL, HDL, LDLCALC, TRIG, CHOLHDL, LDLDIRECT in the last 72 hours. Thyroid Function Tests: No results for input(s): TSH, T4TOTAL, T3FREE, THYROIDAB in the last 72 hours.  Invalid input(s): FREET3 Anemia Panel: No results for input(s): VITAMINB12, FOLATE, FERRITIN, TIBC, IRON, RETICCTPCT in  the last 72 hours.  RADIOLOGY: Dg Chest Port 1 View  Result Date: 10/20/2016 CLINICAL DATA:  Respiratory failure. EXAM: PORTABLE CHEST 1 VIEW COMPARISON:  Radiograph of Oct 19, 2016. FINDINGS: Endotracheal and nasogastric tubes are in grossly good position. Left internal jugular catheter is stable with distal tip in expected position of the SVC. Stable cardiomediastinal silhouette. Mild central pulmonary vascular congestion is noted. No pneumothorax is noted. No significant pleural effusion is noted. Bony thorax is unremarkable. Stable mild bibasilar atelectasis is noted. IMPRESSION: Stable support apparatus. Mild central pulmonary vascular congestion. Stable mild bibasilar subsegmental atelectasis. Electronically Signed   By: Marijo Conception, M.D.   On: 10/20/2016 07:45   Dg Chest Port 1 View  Result Date: 10/19/2016 CLINICAL DATA:  Hypoxia EXAM: PORTABLE CHEST 1 VIEW COMPARISON:  Oct 18, 2016 FINDINGS: Endotracheal tube tip is 1.5  cm above the carina. Central catheter tip is in the superior vena cava. Intra-aortic balloon pump tip is in the proximal descending thoracic aorta at the level of T4-5, stable. Nasogastric tube tip and side port are below the diaphragm. No pneumothorax. There is left and right base atelectatic change. Lungs elsewhere are clear. Heart is mildly enlarged with pulmonary vascularity within normal limits. No adenopathy. No bone lesions. IMPRESSION: Tube and catheter positions as described without pneumothorax. The endotracheal tube tip is rather close to the carina. It may be prudent to consider withdrawing endotracheal tube 1.5-2 cm. Bibasilar atelectasis. Lungs elsewhere clear. Stable cardiac prominence. Electronically Signed   By: Lowella Grip III M.D.   On: 10/19/2016 07:55   Dg Chest Port 1 View  Result Date: 10/18/2016 CLINICAL DATA:  Post cardiac surgery. Subsequent encounter. Intubated patient. EXAM: PORTABLE CHEST 1 VIEW COMPARISON:  10/17/2016 FINDINGS: Mild interstitial and hazy airspace lung opacities predominating centrally are stable consistent with mild pulmonary edema. No new lung abnormalities. No pneumothorax. Endotracheal tube, left internal jugular central venous line, nasogastric tube and aortic balloon pump are stable and well positioned. IMPRESSION: 1. No change from the previous day's study. 2. Mild persistent pulmonary edema. 3. Support apparatus is stable and well positioned. Electronically Signed   By: Lajean Manes M.D.   On: 10/18/2016 07:43   Dg Chest Port 1 View  Result Date: 10/17/2016 CLINICAL DATA:  Status post CABG procedure. EXAM: PORTABLE CHEST 1 VIEW COMPARISON:  10/16/2016 FINDINGS: ET tube tip in satisfactory position above the carina. There is a left IJ catheter with tip at the SVC. NG tube tip is in the stomach. Stable position of ensure aortic balloon pump with marker at the level of the aortic arch. Normal heart size. Mild diffuse edema. IMPRESSION: 1. Stable  appearance of support apparatus. 2. Mild diffuse edema. Electronically Signed   By: Kerby Moors M.D.   On: 10/17/2016 07:49   Dg Chest Port 1 View  Result Date: 10/16/2016 CLINICAL DATA:  Status post intubation EXAM: PORTABLE CHEST 1 VIEW COMPARISON:  10/16/2016 FINDINGS: Cardiac shadow is stable. Endotracheal tube, nasogastric catheter and left jugular central line are noted in satisfactory position. Intra-aortic balloon pump is noted just below the aortic knob. These are all new from the prior exam. Vascular congestion is noted with mild pulmonary edema. The overall appearance is similar to that seen on the prior exam. No pneumothorax is noted. IMPRESSION: Changes consistent with CHF stable from the previous study. Tubes and lines as described. Electronically Signed   By: Inez Catalina M.D.   On: 10/16/2016 12:18   Dg Chest Port 1  View  Result Date: 10/16/2016 CLINICAL DATA:  Code STEMI, chest pain. EXAM: PORTABLE CHEST 1 VIEW COMPARISON:  None in PACs FINDINGS: The lungs are adequately inflated. There is no focal infiltrate. The interstitial markings are mildly prominent. The heart is normal in size. The central pulmonary vascularity is prominent. The mediastinum is normal in width. There is no pleural effusion. The observed bony thorax is unremarkable. IMPRESSION: Mild interstitial prominence bilaterally may reflect low-grade pulmonary edema. No cardiomegaly or alveolar pneumonia. Electronically Signed   By: David  Martinique M.D.   On: 10/16/2016 07:16    PHYSICAL EXAM General:  Weak appearing HEENT: multiple piercings poor dentition Neck: supple. no JVD. Carotids 2+ bilat; no bruits. No lymphadenopathy or thryomegaly appreciated. Cor: PMI nondisplaced. Regular rate & rhythm. No rubs, gallops or murmurs. Lungs: clear Abdomen: soft, nontender, nondistended. No hepatosplenomegaly. No bruits or masses. Good bowel sounds. Extremities: no cyanosis, clubbing, rash, edema Neuro: alert & orientedx3,  cranial nerves grossly intact. moves all 4 extremities w/o difficulty. Affect pleasant   TELEMETRY: NSR 80 Personally reviewed   ASSESSMENT AND PLAN: Mr Knutzen is 66 year old with no known PMH admitted with anterior STEMI complicated by VT arrest and cardiogenic shock.  1. Acute systolic HF with Cardiogenic shock: Echo with EF 20% with normal RV.  - Now extubated. IABP out 5/4. Off norepi. Co-ox 60% - Volume status looks good. CVP 2. Can stop IV lasix - Continue digoxin. Start spiro. No b-blocker or ACE yet due to low BP. Hopefully start low-dose losartan in am. 2. CAD: S/p anterior STEMI, DES placed.  Patient also has a chronically occluded RCA, symptoms suggest possible MI a year or more ago.   - Continue ASA, ticagrelor, statin.  - Will add low-dose spiro  - Consult CR 3. Acute hypoxemic respiratory failure: He was intubated with pulmonary edema on CXR and possible aspiration PNA.   - Extubated 5/5. Has diuresed well. On ceftriaxone per CCM for PNA. WBC dropping. - CXR clearing (reviewed personally) - Can d/c arterial line and Foley.  4. VT arrest: Peri-MI.  - On po amio. Rhythm stable on tele.  - Lifevest at discharge.  5. Hypokalemia - Will supp. Start spiro.    Glori Bickers MD 10/20/2016 10:36 AM

## 2016-10-21 ENCOUNTER — Inpatient Hospital Stay (HOSPITAL_COMMUNITY): Payer: Medicare Other

## 2016-10-21 DIAGNOSIS — I213 ST elevation (STEMI) myocardial infarction of unspecified site: Secondary | ICD-10-CM

## 2016-10-21 LAB — COOXEMETRY PANEL
Carboxyhemoglobin: 1 % (ref 0.5–1.5)
METHEMOGLOBIN: 1 % (ref 0.0–1.5)
O2 Saturation: 57.1 %
Total hemoglobin: 12.5 g/dL (ref 12.0–16.0)

## 2016-10-21 LAB — PHOSPHORUS: PHOSPHORUS: 3.3 mg/dL (ref 2.5–4.6)

## 2016-10-21 LAB — CBC
HEMATOCRIT: 34.8 % — AB (ref 39.0–52.0)
HEMOGLOBIN: 11.4 g/dL — AB (ref 13.0–17.0)
MCH: 24.9 pg — ABNORMAL LOW (ref 26.0–34.0)
MCHC: 32.8 g/dL (ref 30.0–36.0)
MCV: 76.1 fL — ABNORMAL LOW (ref 78.0–100.0)
Platelets: 275 10*3/uL (ref 150–400)
RBC: 4.57 MIL/uL (ref 4.22–5.81)
RDW: 13.8 % (ref 11.5–15.5)
WBC: 17 10*3/uL — AB (ref 4.0–10.5)

## 2016-10-21 LAB — BASIC METABOLIC PANEL
Anion gap: 13 (ref 5–15)
Anion gap: 10 (ref 5–15)
BUN: 17 mg/dL (ref 6–20)
BUN: 20 mg/dL (ref 6–20)
CHLORIDE: 95 mmol/L — AB (ref 101–111)
CO2: 26 mmol/L (ref 22–32)
CO2: 26 mmol/L (ref 22–32)
CREATININE: 1.09 mg/dL (ref 0.61–1.24)
Calcium: 8.4 mg/dL — ABNORMAL LOW (ref 8.9–10.3)
Calcium: 8.7 mg/dL — ABNORMAL LOW (ref 8.9–10.3)
Chloride: 93 mmol/L — ABNORMAL LOW (ref 101–111)
Creatinine, Ser: 0.94 mg/dL (ref 0.61–1.24)
GFR calc Af Amer: >60 mL/min (ref >60)
GFR calc non Af Amer: >60 mL/min (ref >60)
GLUCOSE: 106 mg/dL — AB (ref 65–99)
Glucose, Bld: 112 mg/dL — ABNORMAL HIGH (ref 65–99)
POTASSIUM: 3.8 mmol/L (ref 3.5–5.1)
Potassium: 3.5 mmol/L (ref 3.5–5.1)
SODIUM: 131 mmol/L — AB (ref 135–145)
Sodium: 132 mmol/L — ABNORMAL LOW (ref 135–145)

## 2016-10-21 LAB — MAGNESIUM: Magnesium: 2.5 mg/dL — ABNORMAL HIGH (ref 1.7–2.4)

## 2016-10-21 LAB — CULTURE, BLOOD (ROUTINE X 2)
Culture: NO GROWTH
Culture: NO GROWTH
SPECIAL REQUESTS: ADEQUATE
Special Requests: ADEQUATE

## 2016-10-21 MED ORDER — AMIODARONE HCL 200 MG PO TABS
200.0000 mg | ORAL_TABLET | Freq: Every day | ORAL | Status: DC
  Administered 2016-10-22 – 2016-10-23 (×2): 200 mg via ORAL
  Filled 2016-10-21 (×2): qty 1

## 2016-10-21 MED ORDER — TRAMADOL HCL 50 MG PO TABS
50.0000 mg | ORAL_TABLET | Freq: Four times a day (QID) | ORAL | Status: DC | PRN
  Administered 2016-10-22 – 2016-10-23 (×2): 50 mg via ORAL
  Filled 2016-10-21 (×2): qty 1

## 2016-10-21 NOTE — Progress Notes (Signed)
Patient ID: Gabriel Lopez, male   DOB: 1951-03-18, 66 y.o.   MRN: [id number]   ADVANCED HF ROUNDING NOTE   SUBJECTIVE:   IABP pulled 5/4.  Able to ambulate room today. BP soft but stable. No CP/SOB. CVP 3 - off diuretics. Feet hurt.     Scheduled Meds: . amiodarone  200 mg Oral BID  . aspirin  81 mg Oral Daily  . atorvastatin  80 mg Oral q1800  . digoxin  0.125 mg Oral Daily  . folic acid  1 mg Oral Daily  . heparin subcutaneous  5,000 Units Subcutaneous Q8H  . pantoprazole  40 mg Oral Daily  . senna-docusate  2 tablet Oral Daily  . sodium chloride flush  3 mL Intravenous Q12H  . spironolactone  12.5 mg Oral Daily  . thiamine  100 mg Oral Daily  . ticagrelor  90 mg Oral BID   Continuous Infusions: . sodium chloride 10 mL/hr at 10/19/16 2000   PRN Meds:.acetaminophen (TYLENOL) oral liquid 160 mg/5 mL, albuterol, docusate, nitroGLYCERIN, ondansetron (ZOFRAN) IV, sodium chloride flush    Vitals:   10/21/16 0823 10/21/16 1009 10/21/16 1201 10/21/16 1400  BP:  108/77 98/75 101/63  Pulse:  87 79 81  Resp:   (!) 25 (!) 24  Temp: 98 F (36.7 C)  99.1 F (37.3 C)   TempSrc: Oral  Oral   SpO2:  90% 100% 100%  Weight:      Height:        Intake/Output Summary (Last 24 hours) at 10/21/16 1454 Last data filed at 10/21/16 1400  Gross per 24 hour  Intake             1490 ml  Output             1250 ml  Net              240 ml    LABS: Basic Metabolic Panel:  Recent Labs  40/98/11 0510 10/20/16 1719 10/21/16 0500  NA 134* 132* 132*  K 3.4* 3.8 3.8  CL 93* 93* 93*  CO2 29 27 26   GLUCOSE 139* 95 106*  BUN 17 17 17   CREATININE 1.06 1.01 0.94  CALCIUM 7.9* 8.3* 8.4*  MG 2.3  --  2.5*  PHOS 2.3*  --  3.3   Liver Function Tests: No results for input(s): AST, ALT, ALKPHOS, BILITOT, PROT, ALBUMIN in the last 72 hours. No results for input(s): LIPASE, AMYLASE in the last 72 hours. CBC:  Recent Labs  10/20/16 0510 10/21/16 0500  WBC 16.4* 17.0*  HGB 11.2*  11.4*  HCT 34.3* 34.8*  MCV 76.1* 76.1*  PLT 198 275   Cardiac Enzymes: No results for input(s): CKTOTAL, CKMB, CKMBINDEX, TROPONINI in the last 72 hours. BNP: Invalid input(s): POCBNP D-Dimer: No results for input(s): DDIMER in the last 72 hours. Hemoglobin A1C: No results for input(s): HGBA1C in the last 72 hours. Fasting Lipid Panel: No results for input(s): CHOL, HDL, LDLCALC, TRIG, CHOLHDL, LDLDIRECT in the last 72 hours. Thyroid Function Tests: No results for input(s): TSH, T4TOTAL, T3FREE, THYROIDAB in the last 72 hours.  Invalid input(s): FREET3 Anemia Panel: No results for input(s): VITAMINB12, FOLATE, FERRITIN, TIBC, IRON, RETICCTPCT in the last 72 hours.  RADIOLOGY: Dg Chest Port 1 View  Result Date: 10/21/2016 CLINICAL DATA:  66 year old male status post STEMI, cardiac arrest, resuscitation and percutaneous coronary artery revascularization. EXAM: PORTABLE CHEST 1 VIEW COMPARISON:  10/20/2016 and earlier. FINDINGS: Portable AP semi upright view at  0616 hours. Extubated. Enteric tube removed. Intra aortic balloon pump removed since the 10/19/2016 film. Stable left IJ central line. Mildly lower lung volumes. Stable cardiomegaly and mediastinal contours. Increased right greater than left superior perihilar patchy opacity. No pneumothorax or pleural effusion. More stable appearing lung base ventilation. IMPRESSION: 1. Extubated and enteric tube removed. Left IJ central line remains. 2. Increased asymmetric perihilar opacity, favor due to mild acute pulmonary edema in this clinical setting. Infection felt less likely. Electronically Signed   By: Odessa Fleming M.D.   On: 10/21/2016 07:26   Dg Chest Port 1 View  Result Date: 10/20/2016 CLINICAL DATA:  Respiratory failure. EXAM: PORTABLE CHEST 1 VIEW COMPARISON:  Radiograph of Oct 19, 2016. FINDINGS: Endotracheal and nasogastric tubes are in grossly good position. Left internal jugular catheter is stable with distal tip in expected position  of the SVC. Stable cardiomediastinal silhouette. Mild central pulmonary vascular congestion is noted. No pneumothorax is noted. No significant pleural effusion is noted. Bony thorax is unremarkable. Stable mild bibasilar atelectasis is noted. IMPRESSION: Stable support apparatus. Mild central pulmonary vascular congestion. Stable mild bibasilar subsegmental atelectasis. Electronically Signed   By: Lupita Raider, M.D.   On: 10/20/2016 07:45   Dg Chest Port 1 View  Result Date: 10/19/2016 CLINICAL DATA:  Hypoxia EXAM: PORTABLE CHEST 1 VIEW COMPARISON:  Oct 18, 2016 FINDINGS: Endotracheal tube tip is 1.5 cm above the carina. Central catheter tip is in the superior vena cava. Intra-aortic balloon pump tip is in the proximal descending thoracic aorta at the level of T4-5, stable. Nasogastric tube tip and side port are below the diaphragm. No pneumothorax. There is left and right base atelectatic change. Lungs elsewhere are clear. Heart is mildly enlarged with pulmonary vascularity within normal limits. No adenopathy. No bone lesions. IMPRESSION: Tube and catheter positions as described without pneumothorax. The endotracheal tube tip is rather close to the carina. It may be prudent to consider withdrawing endotracheal tube 1.5-2 cm. Bibasilar atelectasis. Lungs elsewhere clear. Stable cardiac prominence. Electronically Signed   By: Bretta Bang III M.D.   On: 10/19/2016 07:55   Dg Chest Port 1 View  Result Date: 10/18/2016 CLINICAL DATA:  Post cardiac surgery. Subsequent encounter. Intubated patient. EXAM: PORTABLE CHEST 1 VIEW COMPARISON:  10/17/2016 FINDINGS: Mild interstitial and hazy airspace lung opacities predominating centrally are stable consistent with mild pulmonary edema. No new lung abnormalities. No pneumothorax. Endotracheal tube, left internal jugular central venous line, nasogastric tube and aortic balloon pump are stable and well positioned. IMPRESSION: 1. No change from the previous day's  study. 2. Mild persistent pulmonary edema. 3. Support apparatus is stable and well positioned. Electronically Signed   By: Amie Portland M.D.   On: 10/18/2016 07:43   Dg Chest Port 1 View  Result Date: 10/17/2016 CLINICAL DATA:  Status post CABG procedure. EXAM: PORTABLE CHEST 1 VIEW COMPARISON:  10/16/2016 FINDINGS: ET tube tip in satisfactory position above the carina. There is a left IJ catheter with tip at the SVC. NG tube tip is in the stomach. Stable position of ensure aortic balloon pump with marker at the level of the aortic arch. Normal heart size. Mild diffuse edema. IMPRESSION: 1. Stable appearance of support apparatus. 2. Mild diffuse edema. Electronically Signed   By: Signa Kell M.D.   On: 10/17/2016 07:49   Dg Chest Port 1 View  Result Date: 10/16/2016 CLINICAL DATA:  Status post intubation EXAM: PORTABLE CHEST 1 VIEW COMPARISON:  10/16/2016 FINDINGS: Cardiac shadow is stable.  Endotracheal tube, nasogastric catheter and left jugular central line are noted in satisfactory position. Intra-aortic balloon pump is noted just below the aortic knob. These are all new from the prior exam. Vascular congestion is noted with mild pulmonary edema. The overall appearance is similar to that seen on the prior exam. No pneumothorax is noted. IMPRESSION: Changes consistent with CHF stable from the previous study. Tubes and lines as described. Electronically Signed   By: Alcide Clever M.D.   On: 10/16/2016 12:18   Dg Chest Port 1 View  Result Date: 10/16/2016 CLINICAL DATA:  Code STEMI, chest pain. EXAM: PORTABLE CHEST 1 VIEW COMPARISON:  None in PACs FINDINGS: The lungs are adequately inflated. There is no focal infiltrate. The interstitial markings are mildly prominent. The heart is normal in size. The central pulmonary vascularity is prominent. The mediastinum is normal in width. There is no pleural effusion. The observed bony thorax is unremarkable. IMPRESSION: Mild interstitial prominence bilaterally  may reflect low-grade pulmonary edema. No cardiomegaly or alveolar pneumonia. Electronically Signed   By: David  [place] M.D.   On: 10/16/2016 07:16    PHYSICAL EXAM General:  Sitting up in bed. NAD No resp difficulty HEENT: normal except for multiple piercings Neck: supple. RIJ TLC JVP flat Carotids 2+ bilat; no bruits. No lymphadenopathy or thryomegaly appreciated. Cor: PMI nondisplaced. Regular rate & rhythm. No rubs, gallops or murmurs. Lungs: clear Abdomen: soft, nontender, nondistended. No hepatosplenomegaly. No bruits or masses. Good bowel sounds. Extremities: no cyanosis, clubbing, rash, edema. Toes on both feet mildly cyanotic. Tender to touch. R DP 2+ L DP barely palpable Neuro: alert & orientedx3, cranial nerves grossly intact. moves all 4 extremities w/o difficulty. Affect pleasant   TELEMETRY: NSR 70-80 Personally reviewed    ASSESSMENT AND PLAN: Mr Pulis is 66 year old with no known PMH admitted with anterior STEMI complicated by VT arrest and cardiogenic shock.  1. Acute systolic HF with Cardiogenic shock: Echo with EF 20% with normal RV.  - Improved but remains tenuous  - Now extubated. IABP out 5/4. Off norepi. Co-ox 60%->57% - Volume status looks good. Off lasix. CVP 2-3. No diuretics yet  - Continue digoxin and spiro. No b-blocker or ACE yet due to low BP.  2. CAD: S/p anterior STEMI, DES placed.  Patient also has a chronically occluded RCA, symptoms suggest possible MI a year or more ago.   - Continue ASA, ticagrelor, statin and low dose spiro . BP too low for b-blocker.   - Consult CR 3. Acute hypoxemic respiratory failure: He was intubated with pulmonary edema on CXR and possible aspiration PNA.   - Extubated 5/5. Has diuresed well. On ceftriaxone per CCM for PNA. WBC dropping. - CXR ok  4. VT arrest: Peri-MI.  - On po amio. Rhythm stable on tele.  - Can consider Lifevest at discharge although he has not had ventricular ectopy since revascularization 5.  Hypokalemia - Improved. Now on spiro 6. Ischemic toes - Do not appear to be limb threatening. Will follow. Check ABIs. Try to keep SBP > 100. Can consult [facility] as needed  Transfer to SDU.    Arvilla Meres MD 10/21/2016 2:54 PM

## 2016-10-21 NOTE — Evaluation (Signed)
Physical Therapy Evaluation/ Discharge Patient Details Name: Gabriel Lopez MRN: [id number] DOB: Jun 08, 1951 Today's Date: 10/21/2016   History of Present Illness  66 yo s/p STEMI with VF, CPR, intubated 5/1-5/5, cath lab. No known PMHx as pt has never been to the doctor  Clinical Impression  Pt very pleasant with report of sore abdomen. Pt educated for sequence of rolling and side to sit to ease bed mobility transitions. Pt with decreased activity tolerance and decreased ability to reach his feet to don socks but otherwise moving well and able to complete transfers, gait and functional mobility without physical assist. Pt very eager to return to independent function and daughter assists with laundry at baseline. Pt provided necessary education for ease of transitions and progression but otherwise does not require acute therapy at this time and will defer further education and mobility to cardiac rehab, pt aware and agreeable.   HR 87-95 sats 90-100% on RA BP 110/65 pre ambulation, 108/77 post    Follow Up Recommendations No PT follow up    Equipment Recommendations  Rolling walker with 5" wheels    Recommendations for Other Services       Precautions / Restrictions Precautions Precautions: None      Mobility  Bed Mobility Overal bed mobility: Needs Assistance Bed Mobility: Rolling;Sidelying to Sit Rolling: Supervision Sidelying to sit: Supervision       General bed mobility comments: cues for sequence as pt struggling initially with transfer  Transfers Overall transfer level: Modified independent                  Ambulation/Gait Ambulation/Gait assistance: Supervision Ambulation Distance (Feet): 400 Feet Assistive device: Rolling walker (2 wheeled) Gait Pattern/deviations: Step-through pattern;Decreased stride length   Gait velocity interpretation: at or above normal speed for age/gender General Gait Details: pt with cues for safe use of RW but at times not  even utilizing AD  Stairs Stairs: Yes Stairs assistance: Supervision Stair Management: One rail Left;Step to pattern;Forwards Number of Stairs: 4 General stair comments: cues for sequence due to leg pain  Wheelchair Mobility    Modified Rankin (Stroke Patients Only)       Balance Overall balance assessment: No apparent balance deficits (not formally assessed)                                           Pertinent Vitals/Pain Pain Assessment: No/denies pain    Home Living Family/patient expects to be discharged to:: Private residence Living Arrangements: Alone Available Help at Discharge: Family;Available PRN/intermittently Type of Home: House Home Access: Stairs to enter Entrance Stairs-Rails: Doctor, general practice of Steps: 3 Home Layout: One level Home Equipment: Crutches      Prior Function Level of Independence: Independent         Comments: pt works Administrator, [employer]        Extremity/Trunk Assessment   Upper Extremity Assessment Upper Extremity Assessment: Overall WFL for tasks assessed    Lower Extremity Assessment Lower Extremity Assessment: Overall WFL for tasks assessed    Cervical / Trunk Assessment Cervical / Trunk Assessment: Normal  Communication   Communication: No difficulties  Cognition Arousal/Alertness: Awake/alert Behavior During Therapy: WFL for tasks assessed/performed Overall Cognitive Status: Within Functional Limits for tasks assessed  General Comments      Exercises     Assessment/Plan    PT Assessment Patent does not need any further PT services  PT Problem List         PT Treatment Interventions      PT Goals (Current goals can be found in the Care Plan section)  Acute Rehab PT Goals PT Goal Formulation: All assessment and education complete, DC therapy    Frequency     Barriers to discharge         Co-evaluation               AM-PAC PT "6 Clicks" Daily Activity  Outcome Measure Difficulty turning over in bed (including adjusting bedclothes, sheets and blankets)?: A Little Difficulty moving from lying on back to sitting on the side of the bed? : A Little Difficulty sitting down on and standing up from a chair with arms (e.g., wheelchair, bedside commode, etc,.)?: None Help needed moving to and from a bed to chair (including a wheelchair)?: None Help needed walking in hospital room?: None Help needed climbing 3-5 steps with a railing? : None 6 Click Score: 22    End of Session   Activity Tolerance: Patient tolerated treatment well Patient left: in chair;with call bell/phone within reach;with family/visitor present Nurse Communication: Mobility status PT Visit Diagnosis: Other abnormalities of gait and mobility (R26.89)    Time: 9604-5409 PT Time Calculation (min) (ACUTE ONLY): 24 min   Charges:   PT Evaluation $PT Eval Moderate Complexity: 1 Procedure     PT G Codes:        Delaney Meigs, PT [phone]   Fantasia Jinkins B Travares Nelles 10/21/2016, 10:12 AM

## 2016-10-21 NOTE — Progress Notes (Signed)
Patient transported to 3w13 without any issues. Family present at time of transfer.

## 2016-10-21 NOTE — Progress Notes (Signed)
PULMONARY / CRITICAL CARE MEDICINE   Name: Gabriel Lopez MRN: [id number] DOB: 11-20-1950    ADMISSION DATE:  10/16/2016 CONSULTATION DATE:  10/16/2016  REFERRING MD:  Dr. 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  He went into VF to ?Torsades arrest, treated with CPR, epi, defibrillated 5 times, and amiodarone for 10 mins prior to ROSC,   In the cath lab, he was stented x 2 for LAD occulusion, placed on IABP and vasopressors for cardiogenic shock.    SUBJECTIVE: breathinng ok Denies pain   VITAL SIGNS: BP 108/77   Pulse 87   Temp 98 F (36.7 C) (Oral)   Resp 20   Ht 5\' 5"  (1.651 m)   Wt 153 lb 7 oz (69.6 kg)   SpO2 90%   BMI 25.53 kg/m   HEMODYNAMICS: CVP:  [3 mmHg-4 mmHg] 3 mmHg  VENTILATOR SETTINGS:    INTAKE / OUTPUT: I/O last 3 completed shifts: In: 2424.6 [P.O.:960; I.V.:256.3; NG/GT:905; IV Piggyback:303.3] Out: 3850 [Urine:3850]  PHYSICAL EXAMINATION: General:  Older than stated age HEENT: MM pink/moist multiple facial piercings  Neuro: Alert, interactive, non focal CV: s1s2 rrr PULM: even/non-labored  lungs bilaterally clear, diminished in the bases FV:WAQL, non-tender,  bilateral inguinal hernia Extremities: warm/dry, no edema  Skin: no rashes or lesions, multiple tattoos    LABS:  BMET  Recent Labs Lab 10/20/16 0510 10/20/16 1719 10/21/16 0500  NA 134* 132* 132*  K 3.4* 3.8 3.8  CL 93* 93* 93*  CO2 29 27 26   BUN 17 17 17   CREATININE 1.06 1.01 0.94  GLUCOSE 139* 95 106*    Electrolytes  Recent Labs Lab 10/19/16 1555 10/20/16 0510 10/20/16 1719 10/21/16 0500  CALCIUM 7.4* 7.9* 8.3* 8.4*  MG 2.1 2.3  --  2.5*  PHOS 2.8 2.3*  --  3.3    CBC  Recent Labs Lab 10/19/16 0306 10/20/16 0510 10/21/16 0500  WBC 19.7* 16.4* 17.0*  HGB 11.1* 11.2* 11.4*  HCT 34.5* 34.3* 34.8*  PLT 184 198 275     Coag's  Recent Labs Lab 10/16/16 0652 10/16/16 1458  APTT 30  --   INR 0.96 1.59    Sepsis Markers  Recent Labs Lab 10/16/16 1133 10/16/16 1436 10/16/16 2057 10/17/16 0430 10/18/16 0416  LATICACIDVEN  --  2.6* 2.5* 2.1*  --   PROCALCITON 0.24  --   --  3.56 2.77    ABG  Recent Labs Lab 10/18/16 0837 10/18/16 1313 10/19/16 0342  PHART 7.460* 7.519* 7.507*  PCO2ART 31.3* 29.9* 36.5  PO2ART 85.0 114.0* 327*    Liver Enzymes  Recent Labs Lab 10/16/16 0652  AST 37  ALT 23  ALKPHOS 71  BILITOT 0.5  ALBUMIN 3.9    Cardiac Enzymes  Recent Labs Lab 10/16/16 0652 10/16/16 0949 10/16/16 1133  TROPONINI 0.77* 11.39* 25.67*    Glucose  Recent Labs Lab 10/19/16 1948 10/19/16 2323 10/20/16 0425 10/20/16 0842 10/20/16 1151 10/20/16 1617  GLUCAP 104* 116* 157* 114* 104* 87    Imaging Dg Chest Port 1 View  Result Date: 10/21/2016 CLINICAL DATA:  66 year old male status post STEMI, cardiac arrest, resuscitation and percutaneous coronary artery revascularization. EXAM: PORTABLE CHEST 1 VIEW COMPARISON:  10/20/2016 and earlier. FINDINGS: Portable AP semi upright view at 0616 hours. Extubated. Enteric tube removed. Intra aortic balloon pump removed since the 10/19/2016 film. Stable left IJ central  line. Mildly lower lung volumes. Stable cardiomegaly and mediastinal contours. Increased right greater than left superior perihilar patchy opacity. No pneumothorax or pleural effusion. More stable appearing lung base ventilation. IMPRESSION: 1. Extubated and enteric tube removed. Left IJ central line remains. 2. Increased asymmetric perihilar opacity, favor due to mild acute pulmonary edema in this clinical setting. Infection felt less likely. Electronically Signed   By: Odessa Fleming M.D.   On: 10/21/2016 07:26     STUDIES:  EKG 5/1 > anterior STEMI Cardiac Cath 5/1 > RCA 80% stenosed, mid RCA lesion one 95% stenosed, mid RCA lesion two 100% stenosed, prox LAD to mid  LAD lesion 100% sten2osed > DES placed, IABP placed. PCXR 5/1 > CHF. Echo 5/2 >  EF 20%, moderate LVH, mid MR. PAP 34.  CULTURES: BC x 2 5/1 >ng [place] 5/1 >  ANTIBIOTICS:  SIGNIFICANT EVENTS: 5/1 > Admit STEMI/ Cardiac arrest/ cath lab stent x 2/ IABP  LINES/TUBES: ETT 5/1>> 5/5 R sheath 5/1 >> 5/4 L IJ CVL 5/1 >   DISCUSSION: 30 yoM admitted with anterior STEMI, progressed to VF/ ? torsedes arrest, stented x 2 to LAD   ASSESSMENT / PLAN:  PULMONARY A: Acute Respiratory Failure - in the setting of STEMI/ Cardiac Arrest Acute pulmonary edema P:    Extubated  CARDIOVASCULAR A:  Anterior STEMI - s/p 2 stents to LAD.  VF Cardiac Arrest - ? Torsades - down 10 min prior to ROSC.   Cardiogenic Shock - IABP 5/1.  Co-ox 61. Newly diagnosed severe sCHF - EF 20%. P:   advanced heart failure team following Continue Amio , ASA, brilinta, dig per Cards Life vest at discharge  RENAL A:   Hyponatremia - volume overload. Hypokalemia - s/p repletion. Hypophosphatemia. P:   Goal Mg > 2, K > 4   INFECTIOUS A:   Leukocytosis- ? Reactive vs aspiration P:   Dc CTX  cx neg   PCCm available as needed  Cyril Mourning MD. FCCP. [facility] Pager [phone] If no response call [phone]    10/21/2016, 10:45 AM

## 2016-10-22 ENCOUNTER — Inpatient Hospital Stay (HOSPITAL_COMMUNITY): Payer: Medicare Other

## 2016-10-22 DIAGNOSIS — I739 Peripheral vascular disease, unspecified: Secondary | ICD-10-CM

## 2016-10-22 LAB — BASIC METABOLIC PANEL
ANION GAP: 10 (ref 5–15)
BUN: 19 mg/dL (ref 6–20)
CALCIUM: 8.6 mg/dL — AB (ref 8.9–10.3)
CO2: 27 mmol/L (ref 22–32)
Chloride: 97 mmol/L — ABNORMAL LOW (ref 101–111)
Creatinine, Ser: 1.1 mg/dL (ref 0.61–1.24)
Glucose, Bld: 109 mg/dL — ABNORMAL HIGH (ref 65–99)
Potassium: 3.6 mmol/L (ref 3.5–5.1)
Sodium: 134 mmol/L — ABNORMAL LOW (ref 135–145)

## 2016-10-22 NOTE — Progress Notes (Signed)
Phone report received from [facility] on 2 Heart, reviewed orders, labs, VS, tests, meds, general condition, POC, awaiting arrival to unit (3W-13), will assume care when he arrives.

## 2016-10-22 NOTE — Progress Notes (Signed)
CARDIAC REHAB PHASE I   PRE:  Rate/Rhythm: 83 SR c/ PVCs  BP:  Sitting: 113/82        SaO2: 100 RA  MODE:  Ambulation: 500 ft   POST:  Rate/Rhythm: 99 SR c/ PVCs  BP:  Sitting: 111/76         SaO2: 100 RA  Pt ambulated 500 ft on RA, rolling walker, handheld assist, steady gait, tolerated well with no complaints.  Began MI/stent education with pt and family at bedside.  Reviewed risk factors, tobacco cessation (pt declined fake cigarette, states he plans to quit cold [place]), MI book, anti-platelet therapy, stent card, and activity restrictions. Left low sodium and heart healthy diet handouts for pt to review (pt eating egg and cheese biscuit from [employer]).  Pt verbalized understanding, states he is somewhat overwhelmed. Pt to see case manager regarding brilinta prior to discharge. Will follow up tomorrow to continue education (heart failure, exercise, ntg, ph2, and diet) and ambulate. Pt to recliner after walk, call bell within reach.   [phone] Joylene Grapes, RN, BSN 10/22/2016 9:48 AM

## 2016-10-22 NOTE — Progress Notes (Signed)
VASCULAR LAB PRELIMINARY  ARTERIAL  ABI completed: Right ABI of 0.95 and left ABI of 1 are suggestive of arterial flow within normal limits.   RIGHT    LEFT    PRESSURE WAVEFORM  PRESSURE WAVEFORM  BRACHIAL 98 Triphasic BRACHIAL 112 Triphasic  DP 106 Triphasic DP 99 Biphasic  PT 103 Biphasic PT 112 Triphasic    RIGHT LEFT  ABI 0.95 1     Elsie Stain, RVT 10/22/2016, 11:32 AM

## 2016-10-22 NOTE — Care Management Important Message (Signed)
Important Message  Patient Details  Name: Gabriel Lopez MRN: [id number] Date of Birth: [birth year]   Medicare Important Message Given:  Yes    [name] 10/22/2016, 2:05 PM

## 2016-10-22 NOTE — Progress Notes (Signed)
Patient ID: Gabriel Lopez, male   DOB: July 23, 1950, 66 y.o.   MRN: [id number]   ADVANCED HF ROUNDING NOTE   SUBJECTIVE:   IABP pulled 5/4.  TLC pulled 10/21/16. No CVP or Coox.  Feet less tender today. Able to ambulate room yesterday. Anxious to walk halls. Denies lightheadedness or dizziness.  Denies CP or SOB.   Scheduled Meds: . amiodarone  200 mg Oral Daily  . aspirin  81 mg Oral Daily  . atorvastatin  80 mg Oral q1800  . digoxin  0.125 mg Oral Daily  . folic acid  1 mg Oral Daily  . heparin subcutaneous  5,000 Units Subcutaneous Q8H  . pantoprazole  40 mg Oral Daily  . senna-docusate  2 tablet Oral Daily  . sodium chloride flush  3 mL Intravenous Q12H  . spironolactone  12.5 mg Oral Daily  . thiamine  100 mg Oral Daily  . ticagrelor  90 mg Oral BID   Continuous Infusions: . sodium chloride 10 mL/hr at 10/19/16 2000   PRN Meds:.acetaminophen (TYLENOL) oral liquid 160 mg/5 mL, albuterol, docusate, nitroGLYCERIN, ondansetron (ZOFRAN) IV, sodium chloride flush, traMADol  Vitals:   10/21/16 2321 10/22/16 0500 10/22/16 0525 10/22/16 0836  BP: 103/64  112/81   Pulse: 78  79 84  Resp: (!) 21  20   Temp: 98.4 F (36.9 C) 98.6 F (37 C)    TempSrc: Oral Oral    SpO2: 99%  100%   Weight:  149 lb 1.6 oz (67.6 kg)    Height:        Intake/Output Summary (Last 24 hours) at 10/22/16 0856 Last data filed at 10/22/16 0600  Gross per 24 hour  Intake             1010 ml  Output             1150 ml  Net             -140 ml    LABS: Basic Metabolic Panel:  Recent Labs  68/37/29 0510  10/21/16 0500 10/21/16 2020 10/22/16 0504  NA 134*  < > 132* 131* 134*  K 3.4*  < > 3.8 3.5 3.6  CL 93*  < > 93* 95* 97*  CO2 29  < > 26 26 27   GLUCOSE 139*  < > 106* 112* 109*  BUN 17  < > 17 20 19   CREATININE 1.06  < > 0.94 1.09 1.10  CALCIUM 7.9*  < > 8.4* 8.7* 8.6*  MG 2.3  --  2.5*  --   --   PHOS 2.3*  --  3.3  --   --   < > = values in this interval not displayed. Liver  Function Tests: No results for input(s): AST, ALT, ALKPHOS, BILITOT, PROT, ALBUMIN in the last 72 hours. No results for input(s): LIPASE, AMYLASE in the last 72 hours. CBC:  Recent Labs  10/20/16 0510 10/21/16 0500  WBC 16.4* 17.0*  HGB 11.2* 11.4*  HCT 34.3* 34.8*  MCV 76.1* 76.1*  PLT 198 275   Cardiac Enzymes: No results for input(s): CKTOTAL, CKMB, CKMBINDEX, TROPONINI in the last 72 hours. BNP: Invalid input(s): POCBNP D-Dimer: No results for input(s): DDIMER in the last 72 hours. Hemoglobin A1C: No results for input(s): HGBA1C in the last 72 hours. Fasting Lipid Panel: No results for input(s): CHOL, HDL, LDLCALC, TRIG, CHOLHDL, LDLDIRECT in the last 72 hours. Thyroid Function Tests: No results for input(s): TSH, T4TOTAL, T3FREE, THYROIDAB in the last  72 hours.  Invalid input(s): FREET3 Anemia Panel: No results for input(s): VITAMINB12, FOLATE, FERRITIN, TIBC, IRON, RETICCTPCT in the last 72 hours.  RADIOLOGY: Dg Chest Port 1 View  Result Date: 10/21/2016 CLINICAL DATA:  66 year old male status post STEMI, cardiac arrest, resuscitation and percutaneous coronary artery revascularization. EXAM: PORTABLE CHEST 1 VIEW COMPARISON:  10/20/2016 and earlier. FINDINGS: Portable AP semi upright view at 0616 hours. Extubated. Enteric tube removed. Intra aortic balloon pump removed since the 10/19/2016 film. Stable left IJ central line. Mildly lower lung volumes. Stable cardiomegaly and mediastinal contours. Increased right greater than left superior perihilar patchy opacity. No pneumothorax or pleural effusion. More stable appearing lung base ventilation. IMPRESSION: 1. Extubated and enteric tube removed. Left IJ central line remains. 2. Increased asymmetric perihilar opacity, favor due to mild acute pulmonary edema in this clinical setting. Infection felt less likely. Electronically Signed   By: Odessa Fleming M.D.   On: 10/21/2016 07:26   Dg Chest Port 1 View  Result Date:  10/20/2016 CLINICAL DATA:  Respiratory failure. EXAM: PORTABLE CHEST 1 VIEW COMPARISON:  Radiograph of Oct 19, 2016. FINDINGS: Endotracheal and nasogastric tubes are in grossly good position. Left internal jugular catheter is stable with distal tip in expected position of the SVC. Stable cardiomediastinal silhouette. Mild central pulmonary vascular congestion is noted. No pneumothorax is noted. No significant pleural effusion is noted. Bony thorax is unremarkable. Stable mild bibasilar atelectasis is noted. IMPRESSION: Stable support apparatus. Mild central pulmonary vascular congestion. Stable mild bibasilar subsegmental atelectasis. Electronically Signed   By: Lupita Raider, M.D.   On: 10/20/2016 07:45   Dg Chest Port 1 View  Result Date: 10/19/2016 CLINICAL DATA:  Hypoxia EXAM: PORTABLE CHEST 1 VIEW COMPARISON:  Oct 18, 2016 FINDINGS: Endotracheal tube tip is 1.5 cm above the carina. Central catheter tip is in the superior vena cava. Intra-aortic balloon pump tip is in the proximal descending thoracic aorta at the level of T4-5, stable. Nasogastric tube tip and side port are below the diaphragm. No pneumothorax. There is left and right base atelectatic change. Lungs elsewhere are clear. Heart is mildly enlarged with pulmonary vascularity within normal limits. No adenopathy. No bone lesions. IMPRESSION: Tube and catheter positions as described without pneumothorax. The endotracheal tube tip is rather close to the carina. It may be prudent to consider withdrawing endotracheal tube 1.5-2 cm. Bibasilar atelectasis. Lungs elsewhere clear. Stable cardiac prominence. Electronically Signed   By: Bretta Bang III M.D.   On: 10/19/2016 07:55   Dg Chest Port 1 View  Result Date: 10/18/2016 CLINICAL DATA:  Post cardiac surgery. Subsequent encounter. Intubated patient. EXAM: PORTABLE CHEST 1 VIEW COMPARISON:  10/17/2016 FINDINGS: Mild interstitial and hazy airspace lung opacities predominating centrally are stable  consistent with mild pulmonary edema. No new lung abnormalities. No pneumothorax. Endotracheal tube, left internal jugular central venous line, nasogastric tube and aortic balloon pump are stable and well positioned. IMPRESSION: 1. No change from the previous day's study. 2. Mild persistent pulmonary edema. 3. Support apparatus is stable and well positioned. Electronically Signed   By: Amie Portland M.D.   On: 10/18/2016 07:43   Dg Chest Port 1 View  Result Date: 10/17/2016 CLINICAL DATA:  Status post CABG procedure. EXAM: PORTABLE CHEST 1 VIEW COMPARISON:  10/16/2016 FINDINGS: ET tube tip in satisfactory position above the carina. There is a left IJ catheter with tip at the SVC. NG tube tip is in the stomach. Stable position of ensure aortic balloon pump with marker at  the level of the aortic arch. Normal heart size. Mild diffuse edema. IMPRESSION: 1. Stable appearance of support apparatus. 2. Mild diffuse edema. Electronically Signed   By: Signa Kell M.D.   On: 10/17/2016 07:49   Dg Chest Port 1 View  Result Date: 10/16/2016 CLINICAL DATA:  Status post intubation EXAM: PORTABLE CHEST 1 VIEW COMPARISON:  10/16/2016 FINDINGS: Cardiac shadow is stable. Endotracheal tube, nasogastric catheter and left jugular central line are noted in satisfactory position. Intra-aortic balloon pump is noted just below the aortic knob. These are all new from the prior exam. Vascular congestion is noted with mild pulmonary edema. The overall appearance is similar to that seen on the prior exam. No pneumothorax is noted. IMPRESSION: Changes consistent with CHF stable from the previous study. Tubes and lines as described. Electronically Signed   By: Alcide Clever M.D.   On: 10/16/2016 12:18   Dg Chest Port 1 View  Result Date: 10/16/2016 CLINICAL DATA:  Code STEMI, chest pain. EXAM: PORTABLE CHEST 1 VIEW COMPARISON:  None in PACs FINDINGS: The lungs are adequately inflated. There is no focal infiltrate. The interstitial  markings are mildly prominent. The heart is normal in size. The central pulmonary vascularity is prominent. The mediastinum is normal in width. There is no pleural effusion. The observed bony thorax is unremarkable. IMPRESSION: Mild interstitial prominence bilaterally may reflect low-grade pulmonary edema. No cardiomegaly or alveolar pneumonia. Electronically Signed   By: David  [place] M.D.   On: 10/16/2016 07:16   PHYSICAL EXAM  General: Lying in bed. No resp difficulty. HEENT: Normal multiple piercings Neck: supple. TLC site with dressing, C/D/I. JVP flat. Carotids 2+ bilat; no bruits. No thyromegaly or nodule noted. Cor: PMI nondisplaced. RRR, No M/G/R noted Lungs: CTAB, normal effort. Abdomen: soft, non-tender, distended, no HSM. No bruits or masses. +BS  Extremities: no cyanosis, clubbing, rash, R and LLE no edema. Bilateral toes mildly cyanotic. Non-tender. R DP 2+, L DP faint Neuro: alert & orientedx3, cranial nerves grossly intact. moves all 4 extremities w/o difficulty. Affect pleasant   TELEMETRY: Personally reviewed, NSR 70-80   ASSESSMENT AND PLAN: Mr Eid is 66 year old with no known PMH admitted with anterior STEMI complicated by VT arrest and cardiogenic shock. s/p IABP and stable with removal 10/19/16.  1. Acute systolic HF with Cardiogenic shock: Echo with EF 20% with normal RV.  - Improved but remains tenuous  - Extubated. IABP out 10/19/16 without complication. - No coox today with TLC removal. Off norepi. - Volume status looks stable on exam. No lasix today. No line for CVP.  - If pressures stable through the day will consider low dose losartan at bedtime.  - Continue digoxin and spiro. No b-blocker or ACE yet due to low BP and foot ischemia.  2. CAD: S/p anterior STEMI, DES placed.  Patient also has a chronically occluded RCA, symptoms suggest possible MI a year or more ago.   - Continue ASA, ticagrelor, statin and low dose spiro . BP too low for b-blocker and recent  coox marginal.  - Cardiac rehab to see.  3. Acute hypoxemic respiratory failure: He was intubated with pulmonary edema on CXR and possible aspiration PNA.   - Extubated 5/5. Has diuresed well, now off lasix. On ceftriaxone per CCM for PNA. WBC had been trending down. No CBC today.  - Per CCM.  - CXR 10/21/16 reviewed personally. Increase asymmetric perihilar opacity, likely pulmonary edema.   4. VT arrest: Peri-MI.  - On po amio.  Rhythm stable on tele.  - May need to consider Lifevest on d/c, though has not had ectopy since revascularization.  5. Hypokalemia - Improved on spiro. Continue to follow.  6. Ischemic toes - Does not appear limb threatening. Improving today. Less cyanotic and less tender. - ABIs ordered - Will aim to keep SBP > 100.  - Can consult [facility]VS as needed.   Graciella Freer, PA-C  10/22/2016 8:56 AM  Advanced Heart Failure Team Pager [phone] (M-F; 7a - 4p)  Please contact [facility] for night-coverage after hours (4p -7a ) and weekends on [email or web address]  Patient seen and examined with the above-signed Advanced Practice Provider and/or Housestaff. I personally reviewed laboratory data, imaging studies and relevant notes. I independently examined the patient and formulated the important aspects of the plan. I have edited the note to reflect any of my changes or salient points. I have personally discussed the plan with the patient and/or family.  Continue to improve today. Volume status stable off lasix. Will watch closely for need to restart.   BP soft but stable. Ambulating with CR.   Toes improved. ABIs reviewed personally and are normal.   No ectopy on tele. Will discuss LifeVest with Dr. Shirlee Latch who saw him initially.   Possibly home 24-48 hours with close outpatient f/u.   Arvilla Meres, MD  10:03 PM

## 2016-10-23 DIAGNOSIS — I2111 ST elevation (STEMI) myocardial infarction involving right coronary artery: Secondary | ICD-10-CM

## 2016-10-23 LAB — BASIC METABOLIC PANEL
ANION GAP: 12 (ref 5–15)
BUN: 18 mg/dL (ref 6–20)
CALCIUM: 8.8 mg/dL — AB (ref 8.9–10.3)
CO2: 24 mmol/L (ref 22–32)
Chloride: 97 mmol/L — ABNORMAL LOW (ref 101–111)
Creatinine, Ser: 1.11 mg/dL (ref 0.61–1.24)
Glucose, Bld: 111 mg/dL — ABNORMAL HIGH (ref 65–99)
Potassium: 3.4 mmol/L — ABNORMAL LOW (ref 3.5–5.1)
SODIUM: 133 mmol/L — AB (ref 135–145)

## 2016-10-23 MED ORDER — AMIODARONE HCL 200 MG PO TABS: 200.0000 mg | ORAL_TABLET | Freq: Every day | ORAL | 6 refills | Status: AC

## 2016-10-23 MED ORDER — NITROGLYCERIN 0.4 MG SL SUBL: 0.4000 mg | SUBLINGUAL_TABLET | SUBLINGUAL | 12 refills | Status: AC | PRN

## 2016-10-23 MED ORDER — ASPIRIN EC 81 MG PO TBEC: 81.0000 mg | DELAYED_RELEASE_TABLET | Freq: Every day | ORAL | 6 refills | Status: DC

## 2016-10-23 MED ORDER — THIAMINE HCL 100 MG PO TABS: 100.0000 mg | ORAL_TABLET | Freq: Every day | ORAL | 6 refills | Status: AC

## 2016-10-23 MED ORDER — ASPIRIN 81 MG PO CHEW: 81.0000 mg | CHEWABLE_TABLET | Freq: Every day | ORAL | 6 refills | Status: AC

## 2016-10-23 MED ORDER — TRAMADOL HCL 50 MG PO TABS: 50.0000 mg | ORAL_TABLET | Freq: Two times a day (BID) | ORAL | 0 refills | Status: AC | PRN

## 2016-10-23 MED ORDER — SPIRONOLACTONE 25 MG PO TABS: 25.0000 mg | ORAL_TABLET | Freq: Every day | ORAL | 6 refills | Status: AC

## 2016-10-23 MED ORDER — DIGOXIN 125 MCG PO TABS: 0.1250 mg | ORAL_TABLET | Freq: Every day | ORAL | 6 refills | Status: AC

## 2016-10-23 MED ORDER — FOLIC ACID 1 MG PO TABS: 1.0000 mg | ORAL_TABLET | Freq: Every day | ORAL | 6 refills | Status: AC

## 2016-10-23 MED ORDER — ATORVASTATIN CALCIUM 80 MG PO TABS: 80.0000 mg | ORAL_TABLET | Freq: Every day | ORAL | 6 refills | Status: AC

## 2016-10-23 MED ORDER — SPIRONOLACTONE 25 MG PO TABS
25.0000 mg | ORAL_TABLET | Freq: Every day | ORAL | Status: DC
  Administered 2016-10-23: 25 mg via ORAL
  Filled 2016-10-23: qty 1

## 2016-10-23 MED ORDER — TICAGRELOR 90 MG PO TABS: 90.0000 mg | ORAL_TABLET | Freq: Two times a day (BID) | ORAL | 3 refills | Status: DC

## 2016-10-23 MED ORDER — DOCUSATE SODIUM 50 MG/5ML PO LIQD: 100.0000 mg | Freq: Two times a day (BID) | ORAL | 0 refills | Status: AC | PRN

## 2016-10-23 MED ORDER — POTASSIUM CHLORIDE CRYS ER 20 MEQ PO TBCR
40.0000 meq | EXTENDED_RELEASE_TABLET | Freq: Once | ORAL | Status: AC
  Administered 2016-10-23: 40 meq via ORAL
  Filled 2016-10-23: qty 2

## 2016-10-23 NOTE — Care Management Note (Addendum)
Case Management Note  Patient Details  Name: Kiano Spiering MRN: [id number] Date of Birth: 03-Mar-1951  Subjective/Objective:  Pt presented for Stemi complicated by VT arrest and cardiogenic shock. Plan for home on Brilinta. [facility] does not have Brilinta in stock. CM did make pt aware to utilize [facility] for medications.                   Action/Plan: Pt is without Rx drug coverage. Patient Assistance form for Brilinta provided to [facility]. CM to provide pt with the 30 day free card. Pt is without a PCP- [facility]- Appointment placed on the AVS. Pt will be able to utilize the [facility] for medications that range in cost from $4.00-$10.00. PT recommendations for RW- per pt he will not need. No further needs from CM @ this time.   Expected Discharge Date:                  Expected Discharge Plan:  Home/Self Care  In-House Referral:  NA  Discharge planning Services  CM Consult, Medication Assistance, Follow Up Appointment Scheduled, [facility].   Post Acute Care Choice:  NA Choice offered to:  NA  DME Arranged:  N/A DME Agency:  NA  HH Arranged:  NA HH Agency:  NA  Status of Service:  Completed, signed off  If discussed at Long Length of Stay Meetings, dates discussed:    Additional Comments:  Gala Lewandowsky, RN 10/23/2016, 9:48 AM

## 2016-10-23 NOTE — Discharge Summary (Signed)
Advanced Heart Failure Discharge Note  Discharge Summary   Patient ID: Gabriel Lopez MRN: [id number], DOB/AGE: [birth year] 66 y.o. Admit date: 10/16/2016 D/C date:     10/23/2016   Primary Discharge Diagnoses:  1. Acute systolic CHF with cardiogenic shock: Echo with EF 20% and normal RV 2. CAD s/p anterior STEMI, DES placed 3. Acute hypoxemic respiratory failure 4. VT arrest: Peri-MI 5. Hypokalemia 6. Ischemic toes  Hospital Course:   Gabriel Lopez is a 66 y.o. male with no previous known PMH prior to this admission. Admitted with SOB, N/V, and CP.  Upon arrival to ED, went into VF and received CPR, Defibrillated x5, intubated, epi, and amio.  Taken emergently to cath lab once stable and received stent to LAD and IABP placed. Placed on vanc and rocephin for suspected aspiration with WBC 21k.  Required pressor support.   Pt gradually improved as diuresed and pressors weaned. Tolerated IABP removal 10/19/16 without complication. Extubated 10/20/16. HF medications adjusted as tolerated.  Began to slowly progress with CR once able to get up.  Lifevest initially considered but pt had no further ectopy s/p revascularization.    Hospital course complicated by pain in feet 10/21/16 with ischemia/cyanosis noted on exam. 2+ RLE DP pulse, but faint LLE DP pulse. ABIs ordered which showed normal arterial flow.  Cyanotic changes improved gradually, as did tenderness in feet.   On day of discharge, RN informed team that patient was still requesting pills be crushed due to trouble swallowing s/p intubation.  Speech evaluation ordered and showed brisk swallow response with no concern for aspiration. With pill dysphagia, Speech Pathologist recommended he continue to crush or split his pills and take in a puree such as apple sauce.   Pt is stable for discharge with close follow up as below. Overall I/O nearly even but patient down 9 lbs from admit.  He will be discharged in stable condition to home.   Discharge  Weight Range: 148 lb Discharge Vitals: Blood pressure 96/65, pulse 79, temperature 98.4 F (36.9 C), temperature source Oral, resp. rate 18, height 5\' 5"  (1.651 m), weight 148 lb 6.4 oz (67.3 kg), SpO2 100 %.  Labs: Lab Results  Component Value Date   WBC 17.0 (H) 10/21/2016   HGB 11.4 (L) 10/21/2016   HCT 34.8 (L) 10/21/2016   MCV 76.1 (L) 10/21/2016   PLT 275 10/21/2016     Recent Labs Lab 10/23/16 0448  NA 133*  K 3.4*  CL 97*  CO2 24  BUN 18  CREATININE 1.11  CALCIUM 8.8*  GLUCOSE 111*   Lab Results  Component Value Date   CHOL 165 10/17/2016   HDL 38 (L) 10/17/2016   LDLCALC 105 (H) 10/17/2016   TRIG 108 10/17/2016   BNP (last 3 results) No results for input(s): BNP in the last 8760 hours.  ProBNP (last 3 results) No results for input(s): PROBNP in the last 8760 hours.   Diagnostic Studies/Procedures   LHC 10/16/2016   Ost RCA to Prox RCA lesion, 80 %stenosed.  Mid RCA-1 lesion, 95 %stenosed.  Mid RCA-2 lesion, 100 %stenosed.  A STENT SYNERGY DES 2.5X16 drug eluting stent was successfully placed.  Prox LAD to Mid LAD lesion, 100 %stenosed.  Post intervention, there is a 0% residual stenosis.  Echo 10/17/16 with LVEF 20%, Mild MR, Mod LAE, PA peak pressure 34 mm Hg  ABIs 10/22/16  Right ABI 0.95  Left   ABI  1.00  Discharge Medications   Allergies as of 10/23/2016  No Known Allergies     Medication List    STOP taking these medications   aspirin EC 81 MG tablet Replaced by:  aspirin 81 MG chewable tablet     TAKE these medications   amiodarone 200 MG tablet Commonly known as:  PACERONE Take 1 tablet (200 mg total) by mouth daily. Start taking on:  10/24/2016   aspirin 81 MG chewable tablet Chew 1 tablet (81 mg total) by mouth daily. Start taking on:  10/24/2016 Replaces:  aspirin EC 81 MG tablet   atorvastatin 80 MG tablet Commonly known as:  LIPITOR Take 1 tablet (80 mg total) by mouth daily at 6 PM.   digoxin 0.125 MG  tablet Commonly known as:  LANOXIN Take 1 tablet (0.125 mg total) by mouth daily. Start taking on:  10/24/2016   docusate 50 MG/5ML liquid Commonly known as:  COLACE Take 10 mLs (100 mg total) by mouth 2 (two) times daily as needed for mild constipation.   folic acid 1 MG tablet Commonly known as:  FOLVITE Take 1 tablet (1 mg total) by mouth daily. Start taking on:  10/24/2016   nitroGLYCERIN 0.4 MG SL tablet Commonly known as:  NITROSTAT Place 1 tablet (0.4 mg total) under the tongue every 5 (five) minutes x 3 doses as needed for chest pain.   ranitidine 75 MG tablet Commonly known as:  ZANTAC Take 150 mg by mouth daily as needed for heartburn.   spironolactone 25 MG tablet Commonly known as:  ALDACTONE Take 1 tablet (25 mg total) by mouth daily. Start taking on:  10/24/2016   thiamine 100 MG tablet Take 1 tablet (100 mg total) by mouth daily. Start taking on:  10/24/2016   ticagrelor 90 MG Tabs tablet Commonly known as:  BRILINTA Take 1 tablet (90 mg total) by mouth 2 (two) times daily.       Disposition   The patient will be discharged in stable condition to home. Discharge Instructions    Amb Referral to Cardiac Rehabilitation    Complete by:  As directed    Diagnosis:   Coronary Stents STEMI     Diet - low sodium heart healthy    Complete by:  As directed    Discharge instructions    Complete by:  As directed    Advance diet slowly as you feel stronger in your swallowing.  We have confirmed that all of your current medications can be crushed.   Increase activity slowly    Complete by:  As directed      Follow-up Information    [facility] Follow up on 10/31/2016.   Specialty:  Cardiology Why:  at 1400 for post hospital follow up. Please bring all of your medications to your visit. The code for parking is 6001. Enter through Holiday representative off of [address]. Undergound parking on your right. Can also park in Lower ED lot  and enter thru blue awning Contact information: [address] [id number] mc [place] [address] [phone]       [facility] Follow up on 2016-11-06.   Why:  2:00 pm Hospital Follow Up- with Jo[facility].  Contact information: [address] [place] [address]       [facility] Follow up.   Why:  Pt will be able to utilize Pharmacy onsite for medications that will range in cost from $4.00-$10.00.  Contact information: [address]  [place] [address] [phone]            Duration of Discharge Encounter: Greater than 35 minutes   Signed, Luane School 10/23/2016, 1:44 PM   Patient seen and examined with the above-signed Advanced Practice Provider and/or Housestaff. I personally reviewed laboratory data, imaging studies and relevant notes. I independently examined the patient and formulated the important aspects of the plan. I have edited the note to reflect any of my changes or salient points. I have personally discussed the plan with the patient and/or family.  He is much improved. Volume status stable. BP soft but stable. No ectopy on monitor. Can d/c home. F/u with CR. We will see back in 1-2 weeks.   Arvilla Meres, MD  5:41 PM

## 2016-10-23 NOTE — Evaluation (Signed)
Clinical/Bedside Swallow Evaluation Patient Details  Name: Gabriel Lopez MRN: [id number] Date of Birth: [birth year]  Today's Date: 10/23/2016 Time: SLP Start Time (ACUTE ONLY): 1300 SLP Stop Time (ACUTE ONLY): 1312 SLP Time Calculation (min) (ACUTE ONLY): 12 min  Past Medical History: No past medical history on file. Past Surgical History:  Past Surgical History:  Procedure Laterality Date  . ABDOMINAL AORTOGRAM N/A 10/16/2016   Procedure: Abdominal Aortogram;  Surgeon: Lennette Bihari, MD;  Location: [facility] INVASIVE CV LAB;  Service: Cardiovascular;  Laterality: N/A;  . CARDIOVERSION N/A 10/16/2016   Procedure: Cardioversion;  Surgeon: Lennette Bihari, MD;  Location: [facility] INVASIVE CV LAB;  Service: Cardiovascular;  Laterality: N/A;  . IABP INSERTION N/A 10/16/2016   Procedure: IABP Insertion;  Surgeon: Lennette Bihari, MD;  Location: [facility] INVASIVE CV LAB;  Service: Cardiovascular;  Laterality: N/A;  . LEFT HEART CATH AND CORONARY ANGIOGRAPHY N/A 10/16/2016   Procedure: Left Heart Cath and Coronary Angiography;  Surgeon: Lennette Bihari, MD;  Location: [facility] INVASIVE CV LAB;  Service: Cardiovascular;  Laterality: N/A;   HPI:  66 year old with no known PMH admitted with anterior STEMI complicated by VT arrest and cardiogenic shock. ETT 5/01-5/05.    Assessment / Plan / Recommendation Clinical Impression  Pt presents with functional oropharyngeal swallow marked by adequate mastication (some soreness in gums s/p intubation), brisk swallow response, no s/s of aspiration.  Pt describes some difficulty swallowing pills specific to their lodging in throat.  Recommend continued crush or split with pill cutter - take with purees.  No SLP f/u is needed. Pt agrees.  SLP Visit Diagnosis: Dysphagia, unspecified (R13.10)    Aspiration Risk  No limitations    Diet Recommendation     Medication Administration: Crushed with puree    Other  Recommendations Oral Care Recommendations: Oral care BID   Follow up  Recommendations None      Frequency and Duration            Prognosis        Swallow Study   General Date of Onset: 10/16/16 HPI: 66 year old with no known PMH admitted with anterior STEMI complicated by VT arrest and cardiogenic shock. ETT 5/01-5/05.  Type of Study: Bedside Swallow Evaluation Previous Swallow Assessment: no Diet Prior to this Study: Regular Temperature Spikes Noted: No Respiratory Status: Room air History of Recent Intubation: Yes Length of Intubations (days): 4 days Date extubated: 10/20/16 Behavior/Cognition: Alert Oral Cavity Assessment: Within Functional Limits Oral Care Completed by SLP: No Oral Cavity - Dentition: Edentulous Vision: Functional for self-feeding Self-Feeding Abilities: Able to feed self Patient Positioning: Upright in bed Baseline Vocal Quality: Normal Volitional Cough: Strong Volitional Swallow: Able to elicit    Oral/Motor/Sensory Function Overall Oral Motor/Sensory Function: Within functional limits   Ice Chips Ice chips: Not tested   Thin Liquid Thin Liquid: Within functional limits Presentation: Cup;Straw    Nectar Thick Nectar Thick Liquid: Not tested   Honey Thick Honey Thick Liquid: Not tested   Puree Puree: Within functional limits Presentation: Self Fed   Solid   GO   Solid: Within functional limits        Blenda Mounts Laurice 10/23/2016,1:31 PM

## 2016-10-23 NOTE — Progress Notes (Signed)
CARDIAC REHAB PHASE I   PRE:  Rate/Rhythm: 77 SR  BP:  Sitting: 102/68        SaO2: 99 RA  MODE:  Ambulation: 550 ft   POST:  Rate/Rhythm: 89 SR c/ PVCs  BP:  Sitting: 124/80         SaO2: 100 RA  Pt ambulated 550 ft on RA, handheld assist, steady gait, tolerated well.  Pt c/o mild dizziness, denies cp, DOE, brief standing rest x1. Completed MI/stent/CHF education with pt and family at bedside.  Reviewed risk factors, tobacco cessation, anti-platelet therapy, stent card, activity restrictions, ntg, exercise, heart healthy diet, sodium restrictions, CHF booklet and zone tool, daily weights and phase 2 cardiac rehab. Pt and family verbalized understanding. Pt agrees to phase 2 cardiac rehab referral, will send to [facility] per pt request. Pt to recliner after walk, call bell within reach.   [phone]  Joylene Grapes, RN, BSN 10/23/2016 10:36 AM

## 2016-10-23 NOTE — Progress Notes (Signed)
Patient ID: Gabriel Lopez, male   DOB: [birth year], 66 y.o.   MRN: [id number]   ADVANCED HF ROUNDING NOTE   SUBJECTIVE:   Feels good. Walked 500 feet with CR yesterday. Able to get around room without SOB or CP. No dizziness. SBP 90-100   Scheduled Meds: . amiodarone  200 mg Oral Daily  . aspirin  81 mg Oral Daily  . atorvastatin  80 mg Oral q1800  . digoxin  0.125 mg Oral Daily  . folic acid  1 mg Oral Daily  . heparin subcutaneous  5,000 Units Subcutaneous Q8H  . pantoprazole  40 mg Oral Daily  . senna-docusate  2 tablet Oral Daily  . sodium chloride flush  3 mL Intravenous Q12H  . spironolactone  12.5 mg Oral Daily  . thiamine  100 mg Oral Daily  . ticagrelor  90 mg Oral BID   Continuous Infusions: . sodium chloride 10 mL/hr at 10/19/16 2000   PRN Meds:.acetaminophen (TYLENOL) oral liquid 160 mg/5 mL, albuterol, docusate, nitroGLYCERIN, ondansetron (ZOFRAN) IV, sodium chloride flush, traMADol  Vitals:   10/22/16 1300 10/22/16 2115 10/23/16 0033 10/23/16 0500  BP: 97/65 96/67 (!) 88/59 105/65  Pulse: 85 81 70   Resp: (!) 30 (!) 9 (!) 23   Temp: 99 F (37.2 C) 99.1 F (37.3 C) 99.4 F (37.4 C) 98.6 F (37 C)  TempSrc: Oral Oral Oral Oral  SpO2: 100% 99% 97%   Weight:    67.3 kg (148 lb 6.4 oz)  Height:        Intake/Output Summary (Last 24 hours) at 10/23/16 1975 Last data filed at 10/22/16 2015  Gross per 24 hour  Intake                0 ml  Output             1075 ml  Net            -1075 ml    LABS: Basic Metabolic Panel:  Recent Labs  88/32/54 0500  10/22/16 0504 10/23/16 0448  NA 132*  < > 134* 133*  K 3.8  < > 3.6 3.4*  CL 93*  < > 97* 97*  CO2 26  < > 27 24  GLUCOSE 106*  < > 109* 111*  BUN 17  < > 19 18  CREATININE 0.94  < > 1.10 1.11  CALCIUM 8.4*  < > 8.6* 8.8*  MG 2.5*  --   --   --   PHOS 3.3  --   --   --   < > = values in this interval not displayed. Liver Function Tests: No results for input(s): AST, ALT, ALKPHOS, BILITOT, PROT,  ALBUMIN in the last 72 hours. No results for input(s): LIPASE, AMYLASE in the last 72 hours. CBC:  Recent Labs  10/21/16 0500  WBC 17.0*  HGB 11.4*  HCT 34.8*  MCV 76.1*  PLT 275   Cardiac Enzymes: No results for input(s): CKTOTAL, CKMB, CKMBINDEX, TROPONINI in the last 72 hours. BNP: Invalid input(s): POCBNP D-Dimer: No results for input(s): DDIMER in the last 72 hours. Hemoglobin A1C: No results for input(s): HGBA1C in the last 72 hours. Fasting Lipid Panel: No results for input(s): CHOL, HDL, LDLCALC, TRIG, CHOLHDL, LDLDIRECT in the last 72 hours. Thyroid Function Tests: No results for input(s): TSH, T4TOTAL, T3FREE, THYROIDAB in the last 72 hours.  Invalid input(s): FREET3 Anemia Panel: No results for input(s): VITAMINB12, FOLATE, FERRITIN, TIBC, IRON, RETICCTPCT in the last  72 hours.  RADIOLOGY: Dg Chest Port 1 View  Result Date: 10/21/2016 CLINICAL DATA:  66 year old male status post STEMI, cardiac arrest, resuscitation and percutaneous coronary artery revascularization. EXAM: PORTABLE CHEST 1 VIEW COMPARISON:  10/20/2016 and earlier. FINDINGS: Portable AP semi upright view at 0616 hours. Extubated. Enteric tube removed. Intra aortic balloon pump removed since the 10/19/2016 film. Stable left IJ central line. Mildly lower lung volumes. Stable cardiomegaly and mediastinal contours. Increased right greater than left superior perihilar patchy opacity. No pneumothorax or pleural effusion. More stable appearing lung base ventilation. IMPRESSION: 1. Extubated and enteric tube removed. Left IJ central line remains. 2. Increased asymmetric perihilar opacity, favor due to mild acute pulmonary edema in this clinical setting. Infection felt less likely. Electronically Signed   By: Odessa Fleming M.D.   On: 10/21/2016 07:26   Dg Chest Port 1 View  Result Date: 10/20/2016 CLINICAL DATA:  Respiratory failure. EXAM: PORTABLE CHEST 1 VIEW COMPARISON:  Radiograph of Oct 19, 2016. FINDINGS:  Endotracheal and nasogastric tubes are in grossly good position. Left internal jugular catheter is stable with distal tip in expected position of the SVC. Stable cardiomediastinal silhouette. Mild central pulmonary vascular congestion is noted. No pneumothorax is noted. No significant pleural effusion is noted. Bony thorax is unremarkable. Stable mild bibasilar atelectasis is noted. IMPRESSION: Stable support apparatus. Mild central pulmonary vascular congestion. Stable mild bibasilar subsegmental atelectasis. Electronically Signed   By: Lupita Raider, M.D.   On: 10/20/2016 07:45   Dg Chest Port 1 View  Result Date: 10/19/2016 CLINICAL DATA:  Hypoxia EXAM: PORTABLE CHEST 1 VIEW COMPARISON:  Oct 18, 2016 FINDINGS: Endotracheal tube tip is 1.5 cm above the carina. Central catheter tip is in the superior vena cava. Intra-aortic balloon pump tip is in the proximal descending thoracic aorta at the level of T4-5, stable. Nasogastric tube tip and side port are below the diaphragm. No pneumothorax. There is left and right base atelectatic change. Lungs elsewhere are clear. Heart is mildly enlarged with pulmonary vascularity within normal limits. No adenopathy. No bone lesions. IMPRESSION: Tube and catheter positions as described without pneumothorax. The endotracheal tube tip is rather close to the carina. It may be prudent to consider withdrawing endotracheal tube 1.5-2 cm. Bibasilar atelectasis. Lungs elsewhere clear. Stable cardiac prominence. Electronically Signed   By: Bretta Bang III M.D.   On: 10/19/2016 07:55   Dg Chest Port 1 View  Result Date: 10/18/2016 CLINICAL DATA:  Post cardiac surgery. Subsequent encounter. Intubated patient. EXAM: PORTABLE CHEST 1 VIEW COMPARISON:  10/17/2016 FINDINGS: Mild interstitial and hazy airspace lung opacities predominating centrally are stable consistent with mild pulmonary edema. No new lung abnormalities. No pneumothorax. Endotracheal tube, left internal jugular  central venous line, nasogastric tube and aortic balloon pump are stable and well positioned. IMPRESSION: 1. No change from the previous day's study. 2. Mild persistent pulmonary edema. 3. Support apparatus is stable and well positioned. Electronically Signed   By: Amie Portland M.D.   On: 10/18/2016 07:43   Dg Chest Port 1 View  Result Date: 10/17/2016 CLINICAL DATA:  Status post CABG procedure. EXAM: PORTABLE CHEST 1 VIEW COMPARISON:  10/16/2016 FINDINGS: ET tube tip in satisfactory position above the carina. There is a left IJ catheter with tip at the SVC. NG tube tip is in the stomach. Stable position of ensure aortic balloon pump with marker at the level of the aortic arch. Normal heart size. Mild diffuse edema. IMPRESSION: 1. Stable appearance of support apparatus. 2. Mild  diffuse edema. Electronically Signed   By: Signa Kell M.D.   On: 10/17/2016 07:49   Dg Chest Port 1 View  Result Date: 10/16/2016 CLINICAL DATA:  Status post intubation EXAM: PORTABLE CHEST 1 VIEW COMPARISON:  10/16/2016 FINDINGS: Cardiac shadow is stable. Endotracheal tube, nasogastric catheter and left jugular central line are noted in satisfactory position. Intra-aortic balloon pump is noted just below the aortic knob. These are all new from the prior exam. Vascular congestion is noted with mild pulmonary edema. The overall appearance is similar to that seen on the prior exam. No pneumothorax is noted. IMPRESSION: Changes consistent with CHF stable from the previous study. Tubes and lines as described. Electronically Signed   By: Alcide Clever M.D.   On: 10/16/2016 12:18   Dg Chest Port 1 View  Result Date: 10/16/2016 CLINICAL DATA:  Code STEMI, chest pain. EXAM: PORTABLE CHEST 1 VIEW COMPARISON:  None in PACs FINDINGS: The lungs are adequately inflated. There is no focal infiltrate. The interstitial markings are mildly prominent. The heart is normal in size. The central pulmonary vascularity is prominent. The mediastinum is  normal in width. There is no pleural effusion. The observed bony thorax is unremarkable. IMPRESSION: Mild interstitial prominence bilaterally may reflect low-grade pulmonary edema. No cardiomegaly or alveolar pneumonia. Electronically Signed   By: David  [place] M.D.   On: 10/16/2016 07:16   PHYSICAL EXAM  General: Lying in bed. No resp difficulty. NAD HEENT: multiple piercings otherwise normal Neck: supple. TLC site with dressing, C/D/I. JVP 5-6. Carotids 2+ bilat; no bruits. No thyromegaly or nodule noted. Cor: PMI nondisplaced. RRR mo m/r/g Lungs: Clear  Abdomen: soft, NT/ND, no HSM. No bruits or masses.good BS Extremities: no cyanosis, clubbing, rash, no edema. nontender.  Good color  Neuro: alert & orientedx3, cranial nerves grossly intact. moves all 4 extremities w/o difficulty. Affect pleasant   TELEMETRY:NSR 70s. Personally reviewed    ASSESSMENT AND PLAN: Mr Holzschuh is 66 year old with no known PMH admitted with anterior STEMI complicated by VT arrest and cardiogenic shock. s/p IABP and stable with removal 10/19/16.  1. Acute systolic HF with Cardiogenic shock: Echo with EF 20% with normal RV.  - Doing well. Remains euvolemic without lasix. BP soft.  - Continue digoxin and spiro. Will increase spiro to 25 daily. No b-blocker or ACE yet due to low BP. Hopefully can add losartan as outpatient 2. CAD: S/p anterior STEMI, DES placed.  Patient also has a chronically occluded RCA, symptoms suggest possible MI a year or more ago.   - Continue ASA, ticagrelor, statin and spiro . BP too low for b-blocker and recent coox marginal.  - Cardiac rehab seeing. Will need outpatient referral for [facility] 3. Acute hypoxemic respiratory failure: He was intubated with pulmonary edema on CXR and possible aspiration PNA.   - Extubated 5/5. Resolved  4. VT arrest: Peri-MI.  - On po amio. Rhythm stable on tele.  - Will defer LifeVest as rhythm stable since MI. Continue amio for now.  5. Hypokalemia -  Improved on spiro. Continue to follow. Will give KCL 40 today 6. Ischemic toes - Much improved. ABIs ok.   Home today with close f/u in clinc  Arvilla Meres, MD  10/23/2016 6:08 AM  Advanced Heart Failure Team Pager [phone] (M-F; 7a - 4p)  Please contact [facility] for night-coverage after hours (4p -7a ) and weekends on [email or web address]

## 2016-10-23 NOTE — Discharge Instructions (Signed)
Coronary Angiogram With Stent, Care After °This sheet gives you information about how to care for yourself after your procedure. Your health care provider may also give you more specific instructions. If you have problems or questions, contact your health care provider. °What can I expect after the procedure? °After your procedure, it is common to have: °· Bruising in the area where a small, thin tube (catheter) was inserted. This usually fades within 1-2 weeks. °· Blood collecting in the tissue (hematoma) that may be painful to the touch. It should usually decrease in size and tenderness within 1-2 weeks. °Follow these instructions at home: °Insertion area care  °· Do not take baths, swim, or use a hot tub until your health care provider approves. °· You may shower 24-48 hours after the procedure or as directed by your health care provider. °· Follow instructions from your health care provider about how to take care of your incision. Make sure you: °¨ Wash your hands with soap and water before you change your bandage (dressing). If soap and water are not available, use hand sanitizer. °¨ Change your dressing as told by your health care provider. °¨ Leave stitches (sutures), skin glue, or adhesive strips in place. These skin closures may need to stay in place for 2 weeks or longer. If adhesive strip edges start to loosen and curl up, you may trim the loose edges. Do not remove adhesive strips completely unless your health care provider tells you to do that. °· Remove the bandage (dressing) and gently wash the catheter insertion site with plain soap and water. °· Pat the area dry with a clean towel. Do not rub the area, because that may cause bleeding. °· Do not apply powder or lotion to the incision area. °· Check your incision area every day for signs of infection. Check for: °¨ More redness, swelling, or pain. °¨ More fluid or blood. °¨ Warmth. °¨ Pus or a bad smell. °Activity  °· Do not drive for 24 hours if you  were given a medicine to help you relax (sedative). °· Do not lift anything that is heavier than 10 lb (4.5 kg) for 5 days after your procedure or as directed by your health care provider. °· Ask your health care provider when it is okay for you: °¨ To return to work or school. °¨ To resume usual physical activities or sports. °¨ To resume sexual activity. °Eating and drinking  °· Eat a heart-healthy diet. This should include plenty of fresh fruits and vegetables. °· Avoid the following types of food: °¨ Food that is high in salt. °¨ Canned or highly processed food. °¨ Food that is high in saturated fat or sugar. °¨ Fried food. °· Limit alcohol intake to no more than 1 drink a day for non-pregnant women and 2 drinks a day for men. One drink equals 12 oz of beer, 5 oz of wine, or 1½ oz of hard liquor. °Lifestyle  °· Do not use any products that contain nicotine or tobacco, such as cigarettes and e-cigarettes. If you need help quitting, ask your health care provider. °· Take steps to manage and control your weight. °· Get regular exercise. °· Manage your blood pressure. °· Manage other health problems, such as diabetes. °General instructions  °· Take over-the-counter and prescription medicines only as told by your health care provider. Blood thinners may be prescribed after your procedure to improve blood flow through the stent. °· If you need an MRI after your heart stent   has been placed, be sure to tell the health care provider who orders the MRI that you have a heart stent. °· Keep all follow-up visits as directed by your health care provider. This is important. °Contact a health care provider if: °· You have a fever. °· You have chills. °· You have increased bleeding from the catheter insertion area. Hold pressure on the area. °Get help right away if: °· You develop chest pain or shortness of breath. °· You feel faint or you pass out. °· You have unusual pain at the catheter insertion area. °· You have redness,  warmth, or swelling at the catheter insertion area. °· You have drainage (other than a small amount of blood on the dressing) from the catheter insertion area. °· The catheter insertion area is bleeding, and the bleeding does not stop after 30 minutes of holding steady pressure on the area. °· You develop bleeding from any other place, such as from your rectum. There may be bright red blood in your urine or stool, or it may appear as black, tarry stool. °This information is not intended to replace advice given to you by your health care provider. Make sure you discuss any questions you have with your health care provider. °Document Released: 12/22/2004 Document Revised: 03/01/2016 Document Reviewed: 03/01/2016 °Elsevier Interactive Patient Education © 2017 [employer]vier Inc. ° °

## 2016-10-25 ENCOUNTER — Telehealth (HOSPITAL_COMMUNITY): Payer: Self-pay

## 2016-10-25 NOTE — Telephone Encounter (Signed)
Verified Medicare A only insurance benefits through [employer] [phone].Marland Kitchen... KJ

## 2016-10-29 ENCOUNTER — Emergency Department (HOSPITAL_COMMUNITY)
Admission: EM | Admit: 2016-10-29 | Discharge: 2016-11-16 | Disposition: E | Payer: Medicare Other | Attending: Emergency Medicine | Admitting: Emergency Medicine

## 2016-10-29 ENCOUNTER — Encounter: Payer: Self-pay | Admitting: Family Medicine

## 2016-10-29 ENCOUNTER — Other Ambulatory Visit (HOSPITAL_COMMUNITY): Payer: Self-pay | Admitting: *Deleted

## 2016-10-29 ENCOUNTER — Ambulatory Visit (INDEPENDENT_AMBULATORY_CARE_PROVIDER_SITE_OTHER): Payer: Self-pay | Admitting: Family Medicine

## 2016-10-29 VITALS — BP 113/69 | HR 80 | Temp 97.9°F | Resp 18 | Ht 65.0 in | Wt 144.0 lb

## 2016-10-29 DIAGNOSIS — I469 Cardiac arrest, cause unspecified: Secondary | ICD-10-CM

## 2016-10-29 DIAGNOSIS — R7309 Other abnormal glucose: Secondary | ICD-10-CM

## 2016-10-29 DIAGNOSIS — M79672 Pain in left foot: Secondary | ICD-10-CM

## 2016-10-29 DIAGNOSIS — Z5181 Encounter for therapeutic drug level monitoring: Secondary | ICD-10-CM

## 2016-10-29 DIAGNOSIS — M79671 Pain in right foot: Secondary | ICD-10-CM

## 2016-10-29 DIAGNOSIS — I219 Acute myocardial infarction, unspecified: Secondary | ICD-10-CM

## 2016-10-29 DIAGNOSIS — Z13220 Encounter for screening for lipoid disorders: Secondary | ICD-10-CM

## 2016-10-29 DIAGNOSIS — I213 ST elevation (STEMI) myocardial infarction of unspecified site: Secondary | ICD-10-CM

## 2016-10-29 DIAGNOSIS — Z79899 Other long term (current) drug therapy: Secondary | ICD-10-CM

## 2016-10-29 LAB — I-STAT VENOUS BLOOD GAS, ED
ACID-BASE DEFICIT: 18 mmol/L — AB (ref 0.0–2.0)
BICARBONATE: 12.7 mmol/L — AB (ref 20.0–28.0)
O2 Saturation: 32 %
TCO2: 14 mmol/L (ref 0–100)
pCO2, Ven: 50.3 mmHg (ref 44.0–60.0)
pH, Ven: 7.012 — CL (ref 7.250–7.430)
pO2, Ven: 30 mmHg — CL (ref 32.0–45.0)

## 2016-10-29 LAB — I-STAT TROPONIN, ED: TROPONIN I, POC: 2.73 ng/mL — AB (ref 0.00–0.08)

## 2016-10-29 LAB — POCT URINALYSIS DIP (DEVICE)
Bilirubin Urine: NEGATIVE
GLUCOSE, UA: NEGATIVE mg/dL
Hgb urine dipstick: NEGATIVE
Ketones, ur: NEGATIVE mg/dL
Nitrite: NEGATIVE
PROTEIN: NEGATIVE mg/dL
SPECIFIC GRAVITY, URINE: 1.02 (ref 1.005–1.030)
UROBILINOGEN UA: 1 mg/dL (ref 0.0–1.0)
pH: 5.5 (ref 5.0–8.0)

## 2016-10-29 LAB — I-STAT CHEM 8, ED
BUN: 27 mg/dL — AB (ref 6–20)
CALCIUM ION: 1.01 mmol/L — AB (ref 1.15–1.40)
CHLORIDE: 108 mmol/L (ref 101–111)
Creatinine, Ser: 1.1 mg/dL (ref 0.61–1.24)
Glucose, Bld: 312 mg/dL — ABNORMAL HIGH (ref 65–99)
HEMATOCRIT: 30 % — AB (ref 39.0–52.0)
Hemoglobin: 10.2 g/dL — ABNORMAL LOW (ref 13.0–17.0)
Potassium: 4.8 mmol/L (ref 3.5–5.1)
SODIUM: 135 mmol/L (ref 135–145)
TCO2: 15 mmol/L (ref 0–100)

## 2016-10-29 LAB — POCT GLYCOSYLATED HEMOGLOBIN (HGB A1C): HEMOGLOBIN A1C: 6

## 2016-10-29 MED ORDER — EPINEPHRINE PF 1 MG/10ML IJ SOSY
PREFILLED_SYRINGE | INTRAMUSCULAR | Status: AC | PRN
  Administered 2016-10-29 (×4): 1 mg via INTRAVENOUS

## 2016-10-29 MED ORDER — NOREPINEPHRINE BITARTRATE 1 MG/ML IV SOLN
0.0000 ug/min | Freq: Once | INTRAVENOUS | Status: DC
  Administered 2016-10-29: 2 ug/min via INTRAVENOUS

## 2016-10-29 MED ORDER — TENECTEPLASE 50 MG IV KIT
40.0000 mg | PACK | INTRAVENOUS | Status: AC
  Administered 2016-10-29: 40 mg via INTRAVENOUS
  Filled 2016-10-29: qty 10

## 2016-10-29 MED ORDER — SODIUM CHLORIDE 0.9 % IV SOLN
INTRAVENOUS | Status: DC
Start: 2016-10-29 — End: 2016-10-30

## 2016-10-29 MED ORDER — DEXTROSE 5 % IV SOLN
INTRAVENOUS | Status: AC | PRN
  Administered 2016-10-29: 150 mg via INTRAVENOUS

## 2016-10-29 MED ORDER — SODIUM CHLORIDE 0.9 % IV SOLN
INTRAVENOUS | Status: DC | PRN
  Administered 2016-10-29: 1000 mL via INTRAVENOUS

## 2016-10-29 MED ORDER — TICAGRELOR 90 MG PO TABS: 90.0000 mg | ORAL_TABLET | Freq: Two times a day (BID) | ORAL | 3 refills | Status: AC

## 2016-10-29 MED ORDER — SODIUM BICARBONATE 8.4 % IV SOLN
INTRAVENOUS | Status: AC | PRN
  Administered 2016-10-29: 100 meq via INTRAVENOUS

## 2016-10-30 LAB — BLOOD GAS, ARTERIAL

## 2016-10-30 LAB — THYROID PANEL WITH TSH
FREE THYROXINE INDEX: 3.1 (ref 1.4–3.8)
T3 UPTAKE: 38 % — AB (ref 22–35)
T4 TOTAL: 8.1 ug/dL (ref 4.5–12.0)
TSH: 3.86 mIU/L (ref 0.40–4.50)

## 2016-10-30 LAB — COMPLETE METABOLIC PANEL WITH GFR
ALBUMIN: 3.5 g/dL — AB (ref 3.6–5.1)
ALK PHOS: 128 U/L — AB (ref 40–115)
ALT: 109 U/L — AB (ref 9–46)
AST: 45 U/L — ABNORMAL HIGH (ref 10–35)
BUN: 20 mg/dL (ref 7–25)
CALCIUM: 9.5 mg/dL (ref 8.6–10.3)
CHLORIDE: 103 mmol/L (ref 98–110)
CO2: 19 mmol/L — ABNORMAL LOW (ref 20–31)
CREATININE: 1.06 mg/dL (ref 0.70–1.25)
GFR, EST AFRICAN AMERICAN: 84 mL/min (ref >=60)
GFR, Est Non African American: 73 mL/min (ref >=60)
Glucose, Bld: 96 mg/dL (ref 65–99)
POTASSIUM: 5.7 mmol/L — AB (ref 3.5–5.3)
Sodium: 136 mmol/L (ref 135–146)
Total Bilirubin: 0.6 mg/dL (ref 0.2–1.2)
Total Protein: 7.4 g/dL (ref 6.1–8.1)

## 2016-10-30 LAB — CBC WITH DIFFERENTIAL/PLATELET
BASOS PCT: 0 %
Basophils Absolute: 0 cells/uL (ref 0–200)
EOS PCT: 1 %
Eosinophils Absolute: 187 cells/uL (ref 15–500)
HCT: 36 % — ABNORMAL LOW (ref 38.5–50.0)
Hemoglobin: 11.9 g/dL — ABNORMAL LOW (ref 13.2–17.1)
LYMPHS PCT: 22 %
Lymphs Abs: 4114 cells/uL — ABNORMAL HIGH (ref 850–3900)
MCH: 25.3 pg — ABNORMAL LOW (ref 27.0–33.0)
MCHC: 33.1 g/dL (ref 32.0–36.0)
MCV: 76.6 fL — ABNORMAL LOW (ref 80.0–100.0)
MONO ABS: 1122 {cells}/uL — AB (ref 200–950)
MPV: 9.3 fL (ref 7.5–12.5)
Monocytes Relative: 6 %
NEUTROS PCT: 71 %
Neutro Abs: 13277 cells/uL — ABNORMAL HIGH (ref 1500–7800)
PLATELETS: 902 10*3/uL — AB (ref 140–400)
RBC: 4.7 MIL/uL (ref 4.20–5.80)
RDW: 14 % (ref 11.0–15.0)
WBC: 18.7 10*3/uL — AB (ref 3.8–10.8)

## 2016-10-30 LAB — LIPID PANEL
Cholesterol: 149 mg/dL (ref <200)
HDL: 33 mg/dL — ABNORMAL LOW (ref >40)
LDL CALC: 100 mg/dL — AB (ref <100)
TRIGLYCERIDES: 81 mg/dL (ref <150)
Total CHOL/HDL Ratio: 4.5 Ratio (ref <5.0)
VLDL: 16 mg/dL (ref <30)

## 2016-10-30 LAB — PATHOLOGIST SMEAR REVIEW

## 2016-10-30 LAB — MAGNESIUM: MAGNESIUM: 2.5 mg/dL (ref 1.5–2.5)

## 2016-10-31 ENCOUNTER — Inpatient Hospital Stay (HOSPITAL_COMMUNITY): Admit: 2016-10-31 | Payer: Medicare Other

## 2016-11-02 LAB — DIGITOXIN LEVEL: Digitoxin Lvl: NOT DETECTED ng/mL

## 2016-11-04 MED FILL — Medication: Qty: 1 | Status: AC

## 2016-11-16 NOTE — Progress Notes (Signed)
Pt was manually ventilated bagged via ETT 100% with flow resistor valve throughout the duration of the code, ACLS protocol follow.

## 2016-11-16 NOTE — Patient Instructions (Addendum)
I will notify you if your labs are abnormal.    Return for care in 6 weeks for follow-up.  If you experience severe chest pain unrelieved with nitroglycerin, report to the emergency department.  Keep follow-up with cardiology and if head tightness doesn't resolve in 1 week, I will refer you to neurology for further work-up.    Angina Pectoris Angina pectoris is a very bad feeling in the chest, neck, or arm. Your doctor may call it angina. There are four types of angina. Angina is caused by a lack of blood in the middle and thickest layer of the heart wall (myocardium). Angina may feel like a crushing or squeezing pain in the chest. It may feel like tightness or heavy pressure in the chest. Some people say it feels like gas, heartburn, or indigestion. Some people have symptoms other than pain. These include:  Shortness of breath.  Cold sweats.  Feeling sick to your stomach (nausea).  Feeling light-headed. Many women have chest discomfort and some of the other symptoms. However, women often have different symptoms, such as:  Feeling tired (fatigue).  Feeling nervous for no reason.  Feeling weak for no reason.  Dizziness or fainting. Women may have angina without any symptoms. Follow these instructions at home:  Take medicines only as told by your doctor.  Take care of other health issues as told by your doctor. These include:  High blood pressure (hypertension).  Diabetes.  Follow a heart-healthy diet. Your doctor can help you to choose healthy food options and make changes.  Talk to your doctor to learn more about healthy cooking methods and use them. These include:  Roasting.  Grilling.  Broiling.  Baking.  Poaching.  Steaming.  Stir-frying.  Follow an exercise program approved by your doctor.  Keep a healthy weight. Lose weight as told by your doctor.  Rest when you are tired.  Learn to manage stress.  Do not use any tobacco, such as cigarettes,  chewing tobacco, or electronic cigarettes. If you need help quitting, ask your doctor.  If you drink alcohol, and your doctor says it is okay, limit yourself to no more than 1 drink per day. One drink equals 12 ounces of beer, 5 ounces of wine, or 1 ounces of hard liquor.  Stop illegal drug use.  Keep all follow-up visits as told by your doctor. This is important. Do not take these medicines unless your doctor says that you can:  Nonsteroidal anti-inflammatory drugs (NSAIDs). These include:  Ibuprofen.  Naproxen.  Celecoxib.  Vitamin supplements that have vitamin A, vitamin E, or both.  Hormone therapy that contains estrogen with or without progestin. Get help right away if:  You have pain in your chest, neck, arm, jaw, stomach, or back that:  Lasts more than a few minutes.  Comes back.  Does not get better after you take medicine under your tongue (sublingual nitroglycerin).  You have any of these symptoms for no reason:  Gas, heartburn, or indigestion.  Sweating a lot.  Shortness of breath or trouble breathing.  Feeling sick to your stomach or throwing up.  Feeling more tired than usual.  Feeling nervous or worrying more than usual.  Feeling weak.  Diarrhea.  You are suddenly dizzy or light-headed.  You faint or pass out. These symptoms may be an emergency. Do not wait to see if the symptoms will go away. Get medical help right away. Call your local emergency services (911 in the [place] Do not drive yourself to  the hospital. This information is not intended to replace advice given to you by your health care provider. Make sure you discuss any questions you have with your health care provider. Document Released: 11/21/2007 Document Revised: 11/10/2015 Document Reviewed: 10/06/2013 Elsevier Interactive Patient Education  2017 [employer].

## 2016-11-16 NOTE — Code Documentation (Addendum)
TNK given at this time.

## 2016-11-16 NOTE — Progress Notes (Signed)
Patient ID: Gabriel Lopez, male    DOB: Sep 03, 1950, 66 y.o.   MRN: [id number]  PCP: Bing Neighbors, FNP  Chief Complaint  Patient presents with  . Establish Care  . Hospitalization Follow-up    Heart attack    Subjective:  HPI Gabriel Lopez is a 66 y.o. male presents today to establish care and hospital follow-up.   Gabriel Lopez is accompanied by his Lopez today with whom Gabriel Lopez is residing. Medical history on significant for recent STEMI, and Cardiac Arrest post STEMI, Left Heart Cath w/ STENT, CHF EF-20%, and  former daily smoker.  Gabriel Lopez reports events leading up to his STEMI (10/16/2016) as follows: Gabriel Lopez experienced several weeks of nausea and indigestion. Gabriel Lopez attributed to eating too many "ham sandwiches". However on 10/16/2016, Gabriel Lopez awakened "something felt different" and Gabriel Lopez was experiencing continuous vomiting,  sensation of burning in chest and burning in throat. His son-n-law brought via car to [facility] in  which Gabriel Lopez doesn't recall any events after arriving to the Emergency Department. Gabriel Lopez had a left heart catheterization with stent placement. Gabriel Lopez also experienced significant ischemia of bilateral toes to the extent that toes were completely "blue". His Lopez reports that Gabriel Lopez had to be revived several times after experiencing cardiac arrest.  Gabriel Lopez miraculously recovered and was discharged 10/23/2016.  Today Gabriel Lopez reports that up until this recent episode, Gabriel Lopez had never sought out or received medical care. Gabriel Lopez has always maintained  "good health" and doesn't believe in taking "pills". Lopez reports she has to encourage him to take his medications although Gabriel Lopez refuses to take the as needed medication such as tramadol, tylenol, and or nitroglycerin. Gabriel Lopez reports that Gabriel Lopez had an episode over the weekend that she felt Gabriel Lopez over exerted himself by being out all day and she had to force him to take a nitroglycerin as Gabriel Lopez was short of breath and was  experiencing some chest pain.  The pain subsided after the nitroglycerin, however Gabriel Lopez reports that Gabriel Lopez did not like the way the medication tasted or made him feel.  Gabriel Lopez has been progressing well with exercise tolerance. Gabriel Lopez ambulating 60 ft about 2-3 times per day without dypsnea or chest pain. Admits to fatigue and uses a walker when ambulating so that Gabriel Lopez can sit-down on the walker seat if Gabriel Lopez becomes tired. Gabriel Lopez has upcoming appointments scheduled with cardiology and cardiac rehab. Gabriel Lopez is prescribed Brilinta for anticoagulation therapy and denies any associated bleeding.  Reports a "sensation of tension or that someone is pulling his hair" in the back of this head. Scalp is tender to touch. Pain is not within his head more superficially.  Gabriel Lopez feels pain is related to his hair being braided to tightly and the length of time Gabriel Lopez was unconscious and laying flat. Gabriel Lopez has not attempted any pain medication related to the pain. For pain Gabriel Lopez is prescribed Tramadol, although Gabriel Lopez refuses to take the medication.  Social History   Social History  . Marital status: Single    Spouse name: N/A  . Number of children: N/A  . Years of education: N/A   Occupational History  . Not on file.   Social History Main Topics  . Smoking status: Current Every Day Smoker    Types: Cigarettes  . Smokeless tobacco: Never Used  . Alcohol use No  . Drug use: No  . Sexual activity: Not on file   Other Topics Concern  . Not on file  Social History Narrative  . No narrative on file    History reviewed. No pertinent family history. Review of Systems   See HPI  Patient Active Problem List   Diagnosis Date Noted  . Cardiac arrest (HCC) 10/16/2016  . Smoker 10/16/2016  . ST elevation myocardial infarction (STEMI) (HCC) 10/16/2016  . Cardiogenic shock (HCC) 10/16/2016  . Respiratory failure (HCC)     No Known Allergies  Prior to Admission medications   Medication Sig Start Date End Date Taking?  Authorizing Provider  amiodarone (PACERONE) 200 MG tablet Take 1 tablet (200 mg total) by mouth daily. 10/24/16  Yes Graciella Freer, PA-C  aspirin 81 MG chewable tablet Chew 1 tablet (81 mg total) by mouth daily. 10/24/16  Yes Graciella Freer, PA-C  atorvastatin (LIPITOR) 80 MG tablet Take 1 tablet (80 mg total) by mouth daily at 6 PM. 10/23/16  Yes Tillery, Mariam Dollar, PA-C  digoxin (LANOXIN) 0.125 MG tablet Take 1 tablet (0.125 mg total) by mouth daily. 10/24/16  Yes Graciella Freer, PA-C  docusate (COLACE) 50 MG/5ML liquid Take 10 mLs (100 mg total) by mouth 2 (two) times daily as needed for mild constipation. 10/23/16  Yes Graciella Freer, PA-C  folic acid (FOLVITE) 1 MG tablet Take 1 tablet (1 mg total) by mouth daily. 10/24/16  Yes Graciella Freer, PA-C  nitroGLYCERIN (NITROSTAT) 0.4 MG SL tablet Place 1 tablet (0.4 mg total) under the tongue every 5 (five) minutes x 3 doses as needed for chest pain. 10/23/16  Yes Graciella Freer, PA-C  ranitidine (ZANTAC) 75 MG tablet Take 150 mg by mouth daily as needed for heartburn.   Yes [provider]  spironolactone (ALDACTONE) 25 MG tablet Take 1 tablet (25 mg total) by mouth daily. 10/24/16  Yes Graciella Freer, PA-C  thiamine 100 MG tablet Take 1 tablet (100 mg total) by mouth daily. 10/24/16  Yes Graciella Freer, PA-C  ticagrelor (BRILINTA) 90 MG TABS tablet Take 1 tablet (90 mg total) by mouth 2 (two) times daily. 2016-11-20  Yes Graciella Freer, PA-C  traMADol (ULTRAM) 50 MG tablet Take 1 tablet (50 mg total) by mouth every 12 (twelve) hours as needed for moderate pain or severe pain. 10/23/16  Yes Graciella Freer, PA-C    Past Medical, Surgical Family and Social History reviewed and updated.    Objective:   Today's Vitals   11-20-16 1405  BP: 113/69  Pulse: 80  Resp: 18  Temp: 97.9 F (36.6 C)  TempSrc: Oral  SpO2: 100%  Weight: 144 lb (65.3 kg)  Height: 5\' 5"   (1.651 m)    Wt Readings from Last 3 Encounters:  11/20/16 144 lb (65.3 kg)  10/23/16 148 lb 6.4 oz (67.3 kg)   Physical Exam  Constitutional: Gabriel Lopez is oriented to person, place, and time. Gabriel Lopez appears well-developed and well-nourished.  HENT:  Head: Normocephalic and atraumatic.  cranium tenderness with palpation (subjective reported by patient)  Eyes: Conjunctivae are normal. Pupils are equal, round, and reactive to light.  Neck: Normal range of motion. Neck supple.  Cardiovascular: Normal rate, regular rhythm, normal heart sounds and intact distal pulses.   Pulmonary/Chest: Effort normal and breath sounds normal.  Musculoskeletal: Normal range of motion.  Neurological: Gabriel Lopez is alert and oriented to person, place, and time. Gabriel Lopez has normal reflexes.  Skin: Skin is warm and dry.  Bilateral bottom of feet very dry Bilateral toes warm to touch, although skin color is pale in comparison  to other upper extremities color.   Psychiatric: Gabriel Lopez has a normal mood and affect. His behavior is normal. Judgment and thought content normal.    Assessment & Plan:  1. ST elevation myocardial infarction (STEMI), unspecified artery (HCC) - COMPLETE METABOLIC PANEL WITH GFR-evaluate potassium, mildly decreased prior to discharge - CBC with Differential/Platelet-leucocytosis prior to discharge - Magnesium-obtaining a baseline  2. Pain in both feet -Tramadol as prescribed .  3. Lipid screening - Thyroid Panel With TSH - Lipid panel  5. Elevated glucose - POCT glycosylated hemoglobin (Hb A1C)-6.0 prediabetes. Will repeat at 3 months follow-up, if elevated will start metformin  6. Encounter for monitoring digoxin therapy - Digitoxin level   RTC: 1 month    -The patient was given clear instructions to go to ER or return to medical center if symptoms do not improve, worsen or new problems develop. The patient verbalized understanding.   Godfrey Pick. Tiburcio Pea, MSN, FNP-C [facility]  [address] [place], [place] [address] [phone]

## 2016-11-16 NOTE — Code Documentation (Addendum)
Pt arrives via [employer] for CPR; EMS reports witnessed arrest 1 hr PTA, on scene and during transport pt was "in and out of VT" getting 2 shocks via AED on scene and 7 defib shocks by EMS; 5 epi given, 300 amio, I/O L tibia, #[id number], king airway in place, 2.5 versed given en route Pt currently without pulse, CPR on going

## 2016-11-16 NOTE — ED Provider Notes (Signed)
[facility]-EMERGENCY DEPT Provider Note   CSN: [id number] Arrival date & time: 11/11/16  2221     History   Chief Complaint No chief complaint on file.   HPI Gabriel Lopez is a 66 y.o. male.  HPI  Patient presents in extremis via EMS. Per reports patient had a witnessed arrest, approximately one hour prior to ED arrival. Patient was across the street from a department when he had a rest. EMS notes the patient had a total of 7 shocks, while in transport, had multiple periods during which she had return of spontaneous respiration, but had no sustained interactivity during that time. Level V caveat secondary to the acuity of his condition. On arrival the patient is nonverbal, actively receiving CPR.   No past medical history on file.  There are no active problems to display for this patient.   No past surgical history on file.     Home Medications    Prior to Admission medications   Not on File    Family History No family history on file.  Social History Social History  Substance Use Topics  . Smoking status: Not on file  . Smokeless tobacco: Not on file  . Alcohol use Not on file     Allergies   Patient has no allergy information on record.   Review of Systems Review of Systems  Unable to perform ROS: Acuity of condition     Physical Exam Updated Vital Signs BP 104/61   Pulse (!) 0   Resp (!) 0   Wt 154 lb 5.2 oz (70 kg)   SpO2 (!) 0%   Physical Exam  Constitutional:  Unresponsive, actively receiving CPR  HENT:  Head: Normocephalic and atraumatic.  Multiple piercings otherwise unremarkable  Eyes:  Does not track, does not have preserved blink response  Cardiovascular:  Pulses intermittently appreciable in the femoral artery, bilaterally  Pulmonary/Chest: No stridor.  Patient actively receiving assisted ventilation  Abdominal: He exhibits no distension.  Musculoskeletal: He exhibits no deformity.  Neurological:  Unresponsive  Skin: Skin  is warm and dry.  Nursing note and vitals reviewed.    ED Treatments / Results  Labs (all labs ordered are listed, but only abnormal results are displayed) Labs Reviewed  I-STAT CHEM 8, ED - Abnormal; Notable for the following:       Result Value   BUN 27 (*)    Glucose, Bld 312 (*)    Calcium, Ion 1.01 (*)    Hemoglobin 10.2 (*)    HCT 30.0 (*)    All other components within normal limits  CBC WITH DIFFERENTIAL/PLATELET  PROTIME-INR  APTT  COMPREHENSIVE METABOLIC PANEL  TROPONIN I  BRAIN NATRIURETIC PEPTIDE  I-STAT TROPOININ, ED  I-STAT VENOUS BLOOD GAS, ED    EMS rhythm strip, telemetry EKG showed elevation in the ST segment in the anterior leads  EKG  EKG Interpretation  Date/Time:  Monday Nov 11, 2016 22:38:01 EDT Ventricular Rate:  49 PR Interval:    QRS Duration: 184 QT Interval:  479 QTC Calculation: 433 R Axis:   71 Text Interpretation:  possible junctional or sinus rhythm, w substantial artefact Non-specific intra-ventricular conduction delay Abnormal ekg Confirmed by Gerhard Munch [phone]) on 2016/11/11 10:55:06 PM        Procedures Procedures (including critical care time)  Medications Ordered in ED Medications  0.9 %  sodium chloride infusion (not administered)  EPINEPHrine (ADRENALIN) 1 MG/10ML injection (1 mg Intravenous Given 11/11/16 2245)  amiodarone (CORDARONE) 150 mg in  dextrose 5 % 100 mL bolus (150 mg Intravenous New Bag/Given Nov 24, 2016 2238)  sodium bicarbonate injection (100 mEq Intravenous Given 2016/11/24 2247)  tenecteplase (TNKASE) injection 40 mg (40 mg Intravenous Given 11/24/2016 2241)     Initial Impression / Assessment and Plan / ED Course  I have reviewed the triage vital signs and the nursing notes.  Pertinent labs & imaging results that were available during my care of the patient were reviewed by me and considered in my medical decision making (see chart for details).  Immediately after arrival the patient was transferred to our  monitoring according, continued to receive CPR via ACLS protocol. On initial pulse check patient no appreciable pulses, continued to receive CPR. On the second posterior, the patient had quick bedside ultrasound performed, which demonstrated diminished, the preserved contractility, no obvious pericardial effusion.  Subsequently, the patient received continued ACLS protocol resuscitation, with multiple rounds of epinephrine, and initiation of amiodarone drip, and multiple periods of transient return of spontaneous circulation, but no sustained activity. Patient also received TPA given his history of recent stent concern for possible occlusion. After continued CPR, additional epinephrine, bicarbonate, second bedside ultrasound, with no cardiac activity, the patient was declared dead at 2250. Family members aware. The patient is a patient of Dr. Julious Payer, he will be made aware as well.    EMERGENCY DEPARTMENT [place] CARDIAC EXAM "Study: Limited Ultrasound of the Heart and Pericardium"  INDICATIONS:Abnormal vital signs and Cardiac arrest Multiple views of the heart and pericardium were obtained in real-time with a multi-frequency probe.  PERFORMED TK:WIOXB resident physician and myself IMAGES ARCHIVED?: No LIMITATIONS:  Emergent procedure VIEWS USED: Parasternal long axis and Parasternal short axis INTERPRETATION: Cardiac activity present - minimal on first, negligible on second  INTUBATION Performed by: Gerhard Munch  Required items: required blood products, implants, devices, and special equipment available Patient identity confirmed: provided demographic data and hospital-assigned identification number Time out: Immediately prior to procedure a "time out" was called to verify the correct patient, procedure, equipment, support staff and site/side marked as required.  Indications: cardiac arrest  Intubation method: Glidescope Laryngoscopy   Preoxygenation: BVM and king  airway    Tube Size: 7.5 cuffed  Post-procedure assessment: chest rise and ETCO2 monitor Breath sounds: equal and absent over the epigastrium Tube secured with: ETT holder Audible BS bilaterally, w O2 sats improved to 99%   Patient tolerated the procedure well with no immediate complications.   Cardiopulmonary Resuscitation (CPR) Procedure Note Directed/Performed by: Gerhard Munch I personally directed ancillary staff and/or performed CPR in an effort to regain return of spontaneous circulation and to maintain cardiac, neuro and systemic perfusion.       Final Clinical Impressions(s) / ED Diagnoses  Cardiac arrest   Gerhard Munch, MD 2016/11/24 2302

## 2016-11-16 NOTE — Code Documentation (Signed)
Pt beginning to breathe over ET tube and moving extremities

## 2016-11-16 NOTE — Progress Notes (Signed)
   11/10/16 2228  Clinical Encounter Type  Visited With Patient and family together  Visit Type Death;ED;Trauma  Referral From Nurse  Consult/Referral To Chaplain  Spiritual Encounters  Spiritual Needs Literature;Prayer;Emotional;Grief support  Stress Factors  Patient Stress Factors None identified  Family Stress Factors Loss  Pt.66 yr old White Male, history of Stemi and heart failure, came into the ED CPR in progress, cardiac arrest, Death.  Daughter and Wife were present, escorted to the Consult Room B.  Expressed Condolences, offered prayer, emotional support, active listening, ministry of presence, and referral to the Patient Specialist for further assistance if need, gave a list of funeral homes and resources.  Chaplain Janavia Rottman A.Kennedy Bucker, MA-PC, [phone]

## 2016-11-16 NOTE — Code Documentation (Signed)
Patient time of death occurred at 10-14-48

## 2016-11-16 DEATH — deceased

## 2016-12-10 ENCOUNTER — Ambulatory Visit: Payer: Medicare Other | Admitting: Family Medicine

## 2018-12-24 IMAGING — CR DG CHEST 1V PORT
1 series · 1 of 1 positions shown · non-contrast
Comparison: 10/16/2016

CLINICAL DATA: Status post CABG procedure.

EXAM:
PORTABLE CHEST 1 VIEW

[AP]
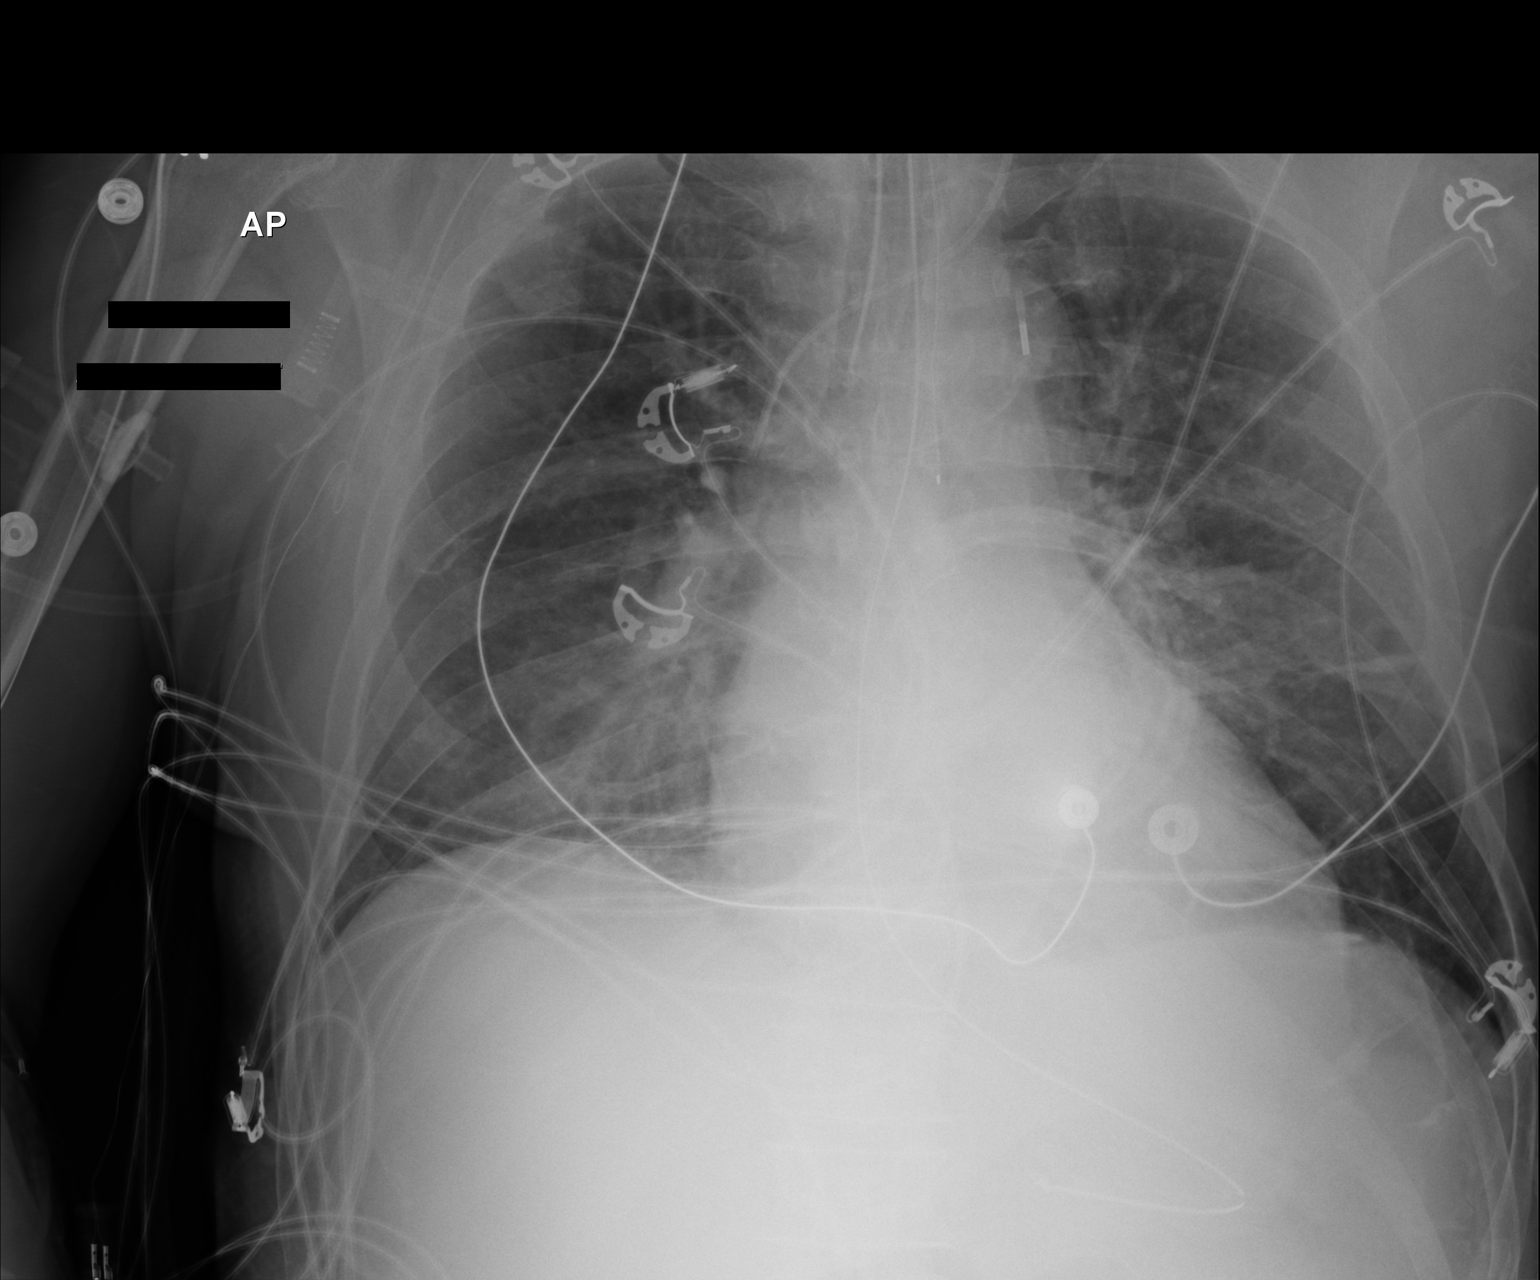

[1 of 1 positions shown; findings below may reference images not displayed]

FINDINGS: ET tube tip in satisfactory position above the carina. There is a
left IJ catheter with tip at the SVC. NG tube tip is in the stomach.
Stable position of ensure aortic balloon pump with marker at the
level of the aortic arch. Normal heart size. Mild diffuse edema.
IMPRESSION: 1. Stable appearance of support apparatus.
2. Mild diffuse edema.

## 2018-12-25 IMAGING — CR DG CHEST 1V PORT
1 series · 1 of 1 positions shown · non-contrast
Comparison: 10/17/2016

CLINICAL DATA: Post cardiac surgery. Subsequent encounter.
Intubated patient.

EXAM:
PORTABLE CHEST 1 VIEW

[AP]
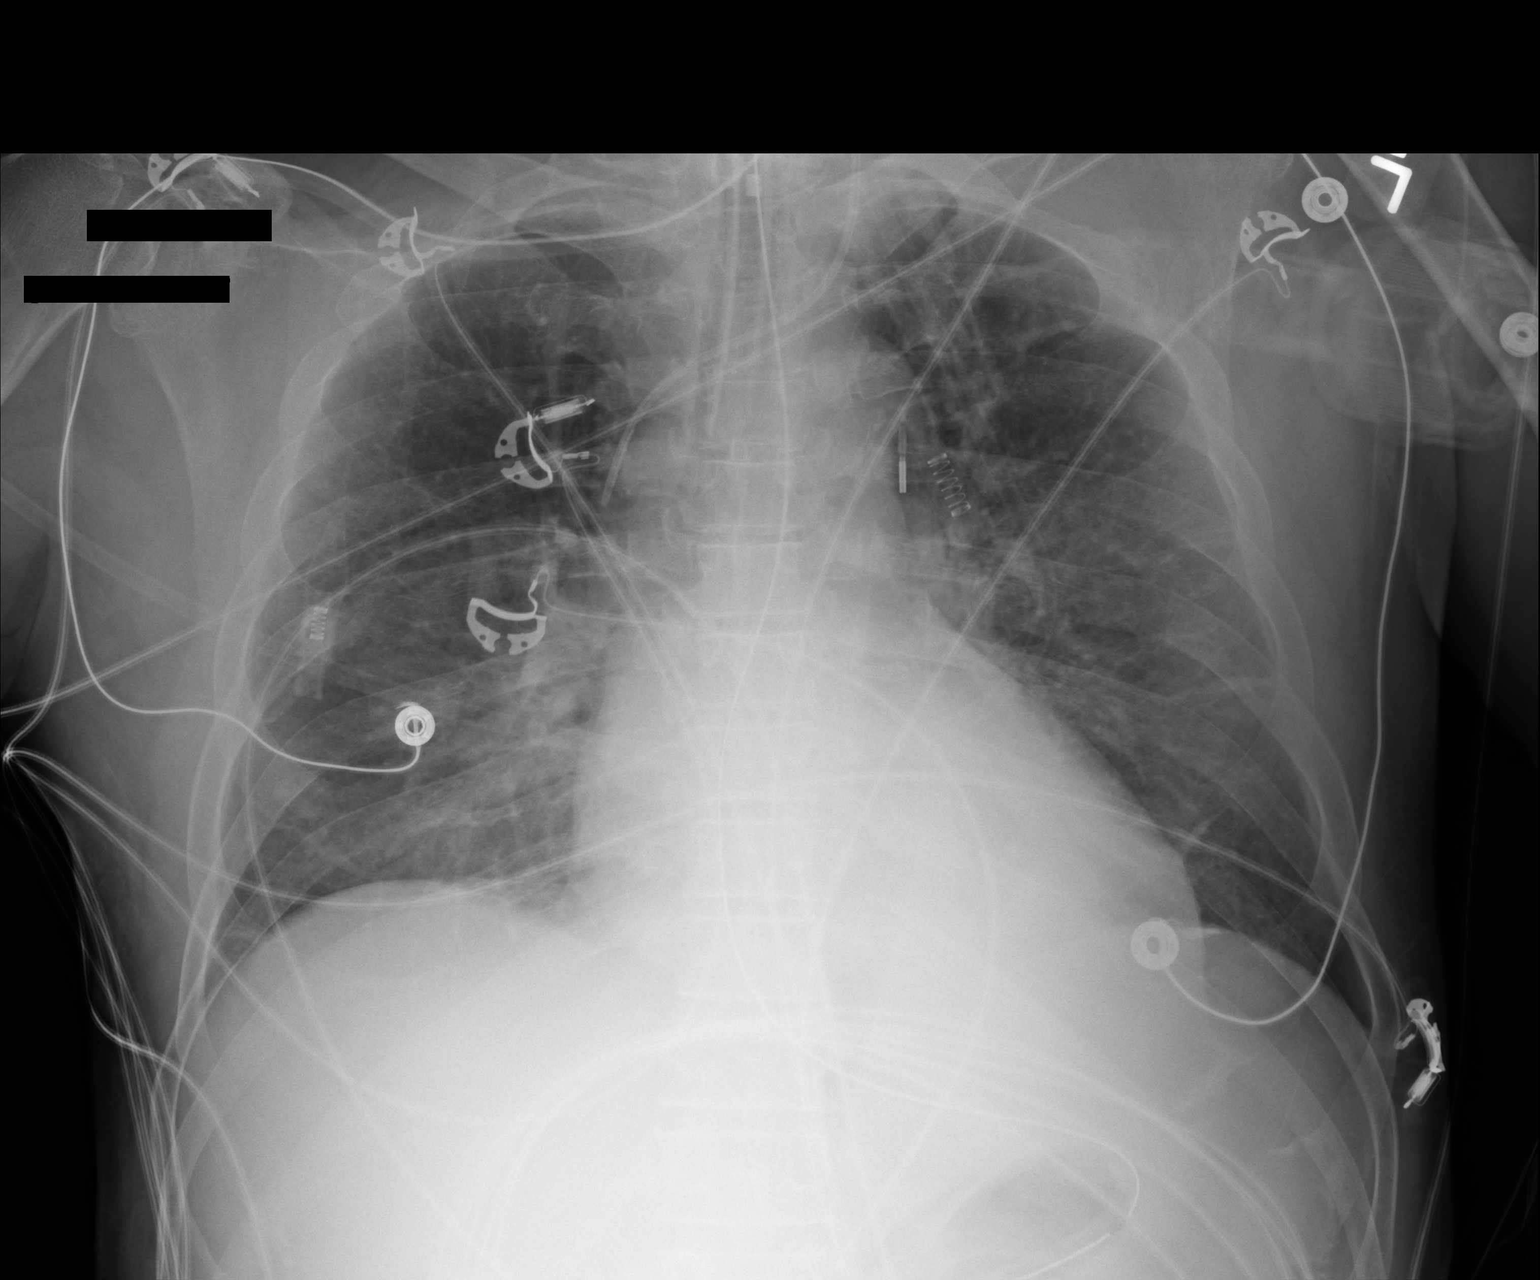

[1 of 1 positions shown; findings below may reference images not displayed]

FINDINGS: Mild interstitial and hazy airspace lung opacities predominating
centrally are stable consistent with mild pulmonary edema. No new
lung abnormalities. No pneumothorax.

Endotracheal tube, left internal jugular central venous line,
nasogastric tube and aortic balloon pump are stable and well
positioned.
IMPRESSION: 1. No change from the previous day's study.
2. Mild persistent pulmonary edema.
3. Support apparatus is stable and well positioned.
